# Patient Record
Sex: Female | Born: 1950 | Race: White | Hispanic: No | Marital: Married | Smoking: Never smoker
Health system: Southern US, Community
[De-identification: ages and names within clinical notes are randomized; demographics above are authoritative.]

## PROBLEM LIST (undated history)

## (undated) DIAGNOSIS — F419 Anxiety disorder, unspecified: Secondary | ICD-10-CM

## (undated) DIAGNOSIS — I10 Essential (primary) hypertension: Secondary | ICD-10-CM

## (undated) DIAGNOSIS — R51 Headache: Secondary | ICD-10-CM

## (undated) DIAGNOSIS — J189 Pneumonia, unspecified organism: Secondary | ICD-10-CM

## (undated) DIAGNOSIS — R519 Headache, unspecified: Secondary | ICD-10-CM

## (undated) HISTORY — PX: OVARIAN CYST SURGERY: SHX726

## (undated) HISTORY — PX: COLONOSCOPY: SHX174

## (undated) HISTORY — PX: PILONIDAL CYST EXCISION: SHX744

## (undated) HISTORY — PX: ABDOMINAL HYSTERECTOMY: SHX81

---

## 1998-10-21 ENCOUNTER — Other Ambulatory Visit: Admission: RE | Admit: 1998-10-21 | Discharge: 1998-10-21 | Payer: Self-pay | Admitting: Obstetrics & Gynecology

## 1999-11-24 ENCOUNTER — Other Ambulatory Visit: Admission: RE | Admit: 1999-11-24 | Discharge: 1999-11-24 | Payer: Self-pay | Admitting: Obstetrics & Gynecology

## 2000-12-16 ENCOUNTER — Other Ambulatory Visit: Admission: RE | Admit: 2000-12-16 | Discharge: 2000-12-16 | Payer: Self-pay | Admitting: Obstetrics & Gynecology

## 2002-02-20 ENCOUNTER — Encounter: Payer: Self-pay | Admitting: Family Medicine

## 2002-02-20 ENCOUNTER — Ambulatory Visit (HOSPITAL_COMMUNITY): Admission: RE | Admit: 2002-02-20 | Discharge: 2002-02-20 | Payer: Self-pay | Admitting: Family Medicine

## 2002-02-25 ENCOUNTER — Other Ambulatory Visit: Admission: RE | Admit: 2002-02-25 | Discharge: 2002-02-25 | Payer: Self-pay | Admitting: Obstetrics & Gynecology

## 2002-02-27 ENCOUNTER — Encounter: Admission: RE | Admit: 2002-02-27 | Discharge: 2002-02-27 | Payer: Self-pay | Admitting: Obstetrics & Gynecology

## 2002-02-27 ENCOUNTER — Encounter: Payer: Self-pay | Admitting: Obstetrics & Gynecology

## 2003-03-23 ENCOUNTER — Other Ambulatory Visit: Admission: RE | Admit: 2003-03-23 | Discharge: 2003-03-23 | Payer: Self-pay | Admitting: Obstetrics & Gynecology

## 2003-09-09 ENCOUNTER — Other Ambulatory Visit: Admission: RE | Admit: 2003-09-09 | Discharge: 2003-09-09 | Payer: Self-pay | Admitting: Obstetrics & Gynecology

## 2004-06-29 ENCOUNTER — Other Ambulatory Visit: Admission: RE | Admit: 2004-06-29 | Discharge: 2004-06-29 | Payer: Self-pay | Admitting: Obstetrics & Gynecology

## 2004-12-12 ENCOUNTER — Inpatient Hospital Stay (HOSPITAL_COMMUNITY): Admission: EM | Admit: 2004-12-12 | Discharge: 2004-12-20 | Payer: Self-pay | Admitting: Emergency Medicine

## 2004-12-18 ENCOUNTER — Ambulatory Visit: Payer: Self-pay | Admitting: Pulmonary Disease

## 2004-12-18 ENCOUNTER — Encounter (INDEPENDENT_AMBULATORY_CARE_PROVIDER_SITE_OTHER): Payer: Self-pay | Admitting: Specialist

## 2004-12-27 ENCOUNTER — Encounter: Admission: RE | Admit: 2004-12-27 | Discharge: 2004-12-27 | Payer: Self-pay | Admitting: Family Medicine

## 2005-01-15 ENCOUNTER — Encounter: Admission: RE | Admit: 2005-01-15 | Discharge: 2005-01-15 | Payer: Self-pay | Admitting: Family Medicine

## 2005-02-05 ENCOUNTER — Other Ambulatory Visit: Admission: RE | Admit: 2005-02-05 | Discharge: 2005-02-05 | Payer: Self-pay | Admitting: Obstetrics & Gynecology

## 2005-03-06 ENCOUNTER — Encounter: Admission: RE | Admit: 2005-03-06 | Discharge: 2005-03-06 | Payer: Self-pay | Admitting: Family Medicine

## 2005-04-18 ENCOUNTER — Encounter: Admission: RE | Admit: 2005-04-18 | Discharge: 2005-04-18 | Payer: Self-pay | Admitting: Obstetrics & Gynecology

## 2005-09-10 ENCOUNTER — Encounter: Admission: RE | Admit: 2005-09-10 | Discharge: 2005-09-10 | Payer: Self-pay | Admitting: Obstetrics & Gynecology

## 2006-11-07 ENCOUNTER — Encounter: Admission: RE | Admit: 2006-11-07 | Discharge: 2006-11-07 | Payer: Self-pay | Admitting: Obstetrics & Gynecology

## 2006-11-19 IMAGING — CR DG CHEST DECUBITUS*R*
1 series · 1 of 1 positions shown · non-contrast
Comparison: 12/18/04

CLINICAL DATA: Evaluate pleural effusion.
 RIGHT LATERAL DECUBITUS RADIOGRAPH OF CHEST ? 1 VIEW:

[view not recorded]
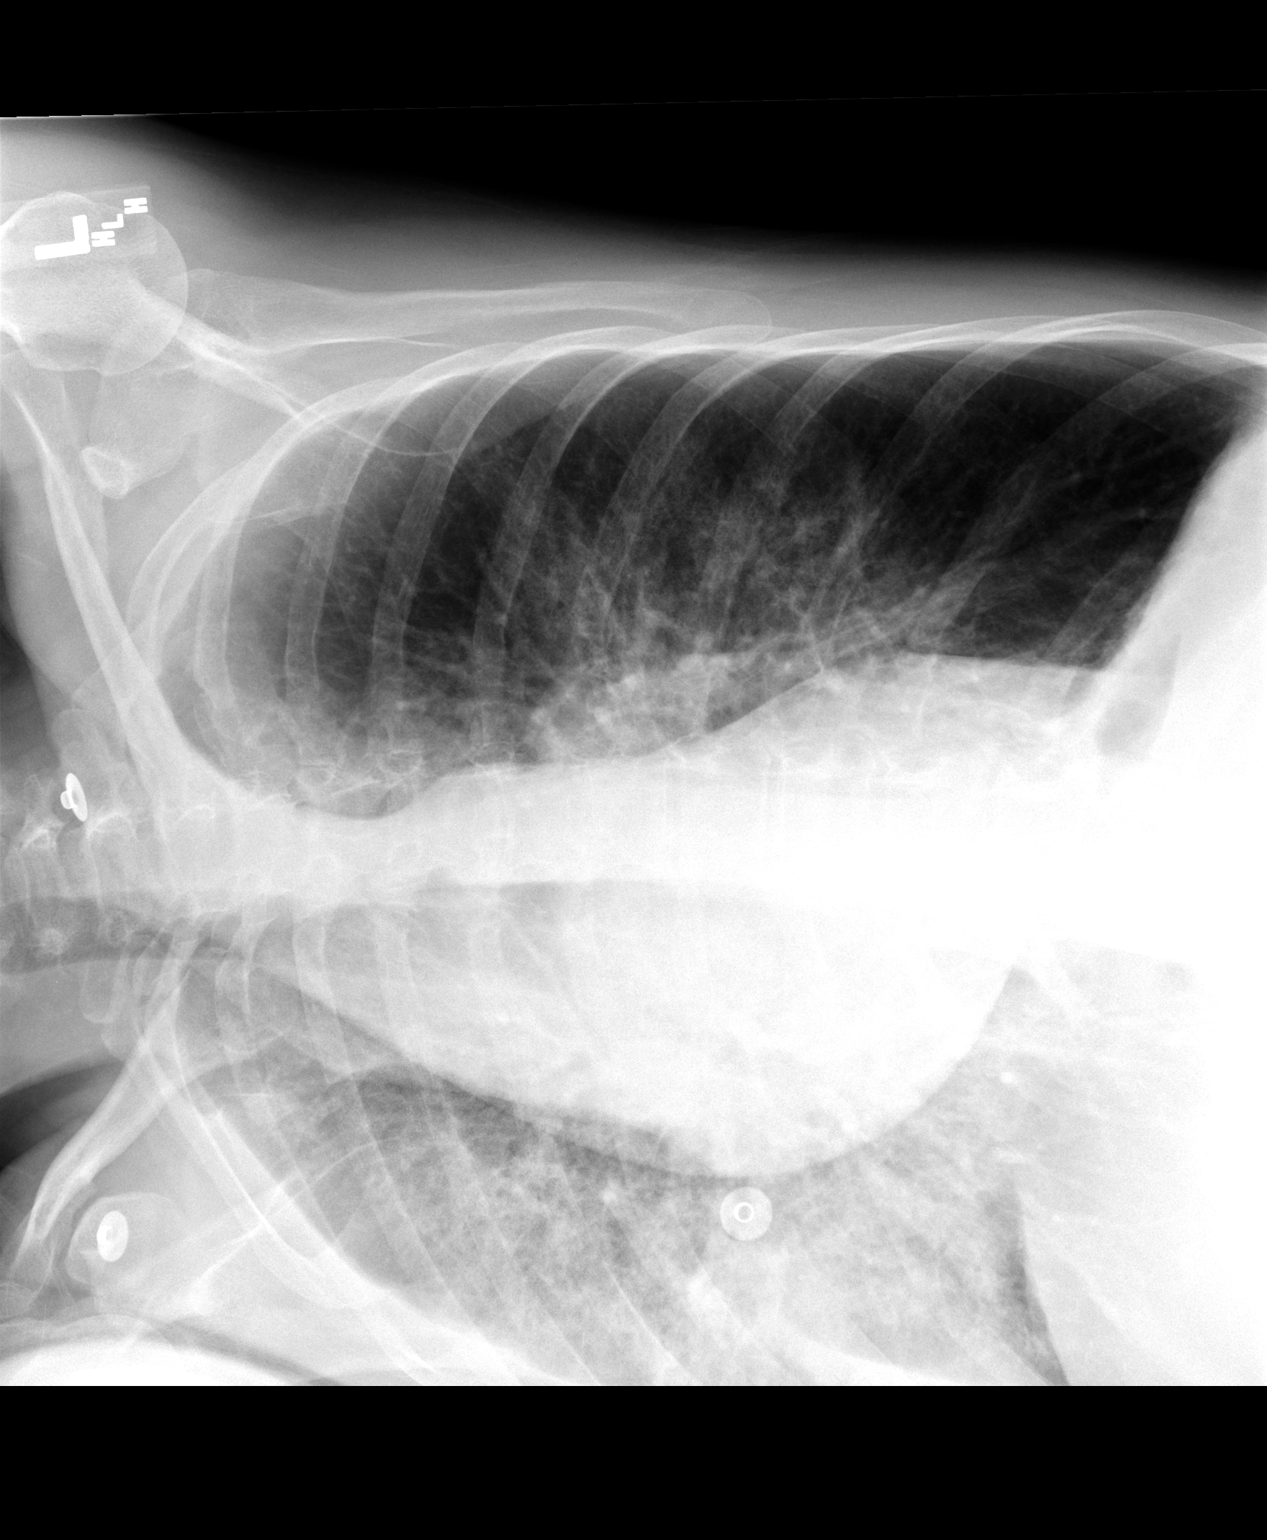

[1 of 1 positions shown; findings below may reference images not displayed]

FINDINGS: There is a moderate sized free flowing right-sided pleural effusion.  Air space disease involves the entire right lung which is unchanged in the interval.  Mild left mid lung air space disease is also noted, unchanged in the interval.
IMPRESSION: Free flowing right effusion.

## 2006-11-19 IMAGING — CR DG CHEST 1V
1 series · 1 of 1 positions shown · non-contrast
Comparison: 11/17/2004.

CLINICAL DATA: Pneumonia.  
 ULTRASOUND-GUIDED THORACENTESIS ON THE RIGHT: 
 After obtaining informed written consent from the patient, the patient?s back was scanned while the patient was sitting.  There is a small to moderate right pleural effusion with some consolidation in the right lower lobe.  The skin was prepped with Betadine.   Two percent lidocaine was used for local anesthesia. An 18-gauge Yueh needle was used for the thoracentesis.  Approximately 180 cc of rust-colored thin fluid which was easy to aspirate were removed from the pleural space.   Needle was removed and the patient tolerated the procedure well. The fluid was sent for laboratory studies as requested.

[view not recorded]
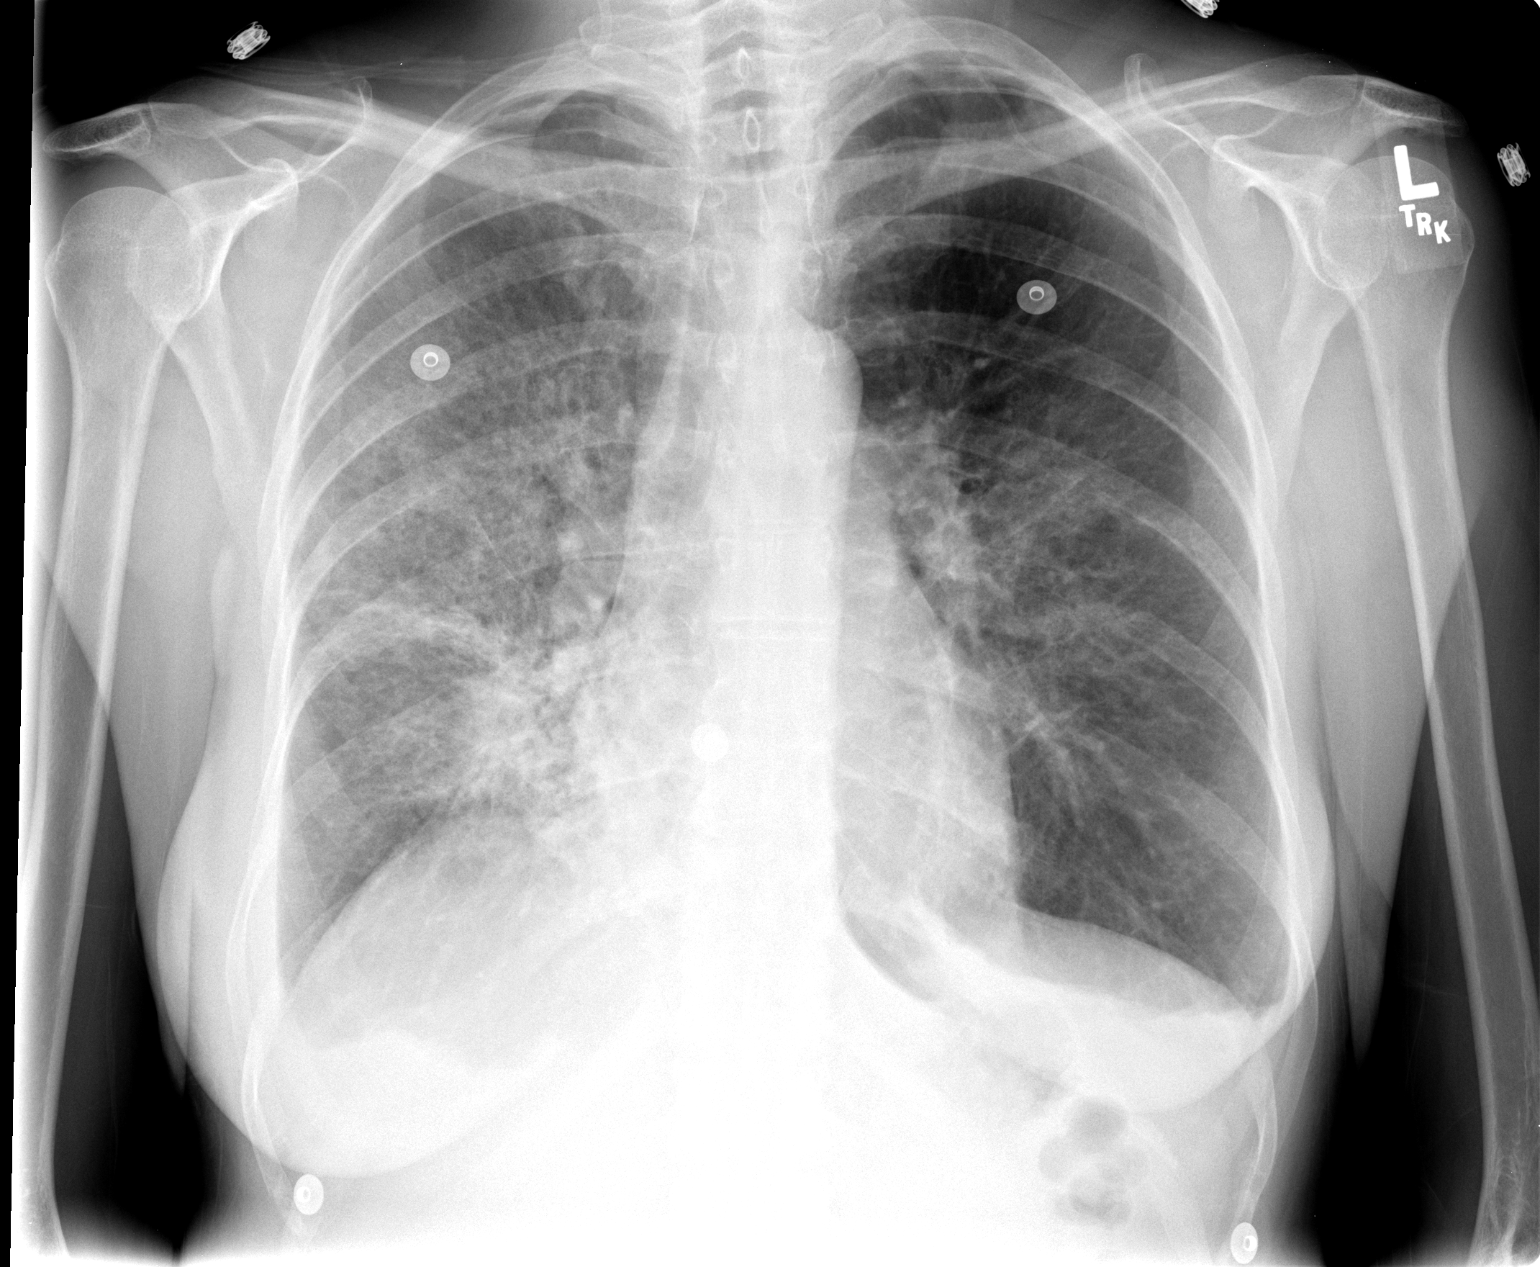

[1 of 1 positions shown; findings below may reference images not displayed]

IMPRESSION: Successful ultrasound-guided thoracentesis on the right.   
 CHEST - 1 VIEW:
Right pleural effusion has improved.  There is no definite pneumothorax although there is possibly a small apical pneumothorax and continued chest x-ray followup is suggested.  Right pneumonia has improved since earlier today.  Left-sided infiltrate also has improved.  There are small bilateral effusions.
IMPRESSION: 1.  Improvement in lung aeration bilaterally. 
 2.  Decrease in right effusion following thoracentesis.  Questionable small right apical pneumothorax versus overlying skin fold.

## 2009-12-20 ENCOUNTER — Encounter: Admission: RE | Admit: 2009-12-20 | Discharge: 2009-12-20 | Payer: Self-pay | Admitting: Obstetrics & Gynecology

## 2010-02-19 ENCOUNTER — Encounter: Payer: Self-pay | Admitting: Obstetrics & Gynecology

## 2010-06-16 NOTE — H&P (Signed)
NAMEMarland Kitchen  CLINTON, WAHLBERG NO.:  [id number]   MEDICAL RECORD NO.:  [id number]          PATIENT TYPE:  EMS   LOCATION:  MAJO                         FACILITY:  [facility]   PHYSICIAN:  Sherin Quarry, MD      DATE OF BIRTH:  08-Apr-1950   DATE OF ADMISSION:  12/12/2004  DATE OF DISCHARGE:                                HISTORY & PHYSICAL   Katrina Gray is a 60 year old lady who works as an Scientist, product/process development. She is generally in excellent health. She does not smoke. On Friday  while visiting her daughter in [place], she began to experience chills  associated with rigors and fever. She took some Aleve that night. The next  day she felt better, but on Sunday she once again had chills and fever  associated with malaise and anorexia. Yesterday she began to experience  substernal chest aching, pleuritic in nature, associated with a cough  productive of somewhat blood-tinged phlegm. She was still up in [place] with  her daughter and eventually she got her family to come get her and brought  her to [place]. On arrival to the emergency room, her temperature was  98.7, blood pressure 109/71, pulse was 126, O2 saturation was 96%. A chest x-  ray was obtained which showed a right middle and lower lobe consolidation  which was fairly extensive. Her laboratory studies are pending at this time.  She is admitted for treatment of a lobar pneumonia.   PAST MEDICAL HISTORY:  Medications:  She takes Lexapro 20 mg daily and uses  a Premarin patch. She is allergic to CODEINE and SULFA DRUGS. Operations:  She had excision of an ovarian cyst at age 30. Subsequently, she had a  TAH/BSO. She has had drainage of pilonidal cyst. Medical illnesses are  essentially none. She recalls that 2 years ago she was treated for a sinus  infection and had a CT scan of her sinuses done at that time. She is also  receiving treatment for depression with Lexapro and does have a prescription  for  Xanax which she will very occasionally take 0.5 mg p.r.n.   FAMILY HISTORY:  Her father died in 35 of cancer of the esophagus. Her  mother is in good health. She has a sister who also is in good health.   SOCIAL HISTORY:  She is very physically active. She does not smoke. She does  not use alcohol or drugs. As previously mentioned, she is employed as an  Set designer.   REVIEW OF SYSTEMS:  HEAD:  She has a dull bitemporal headache. EAR, NOSE AND  THROAT:  She denies earache, sinus pain or sore throat. CHEST:  See above.  She notes that she is slightly dyspneic with exertion. CARDIOVASCULAR:  She  denies orthopnea, PND or ankle edema. GI: Denies nausea, vomiting or  abdominal pain. GU: Denies dysuria or urinary frequency. NEURO:  There is no  history of seizure or stroke. ENDO:  Denies excessive thirst, urinary  frequency or nocturia.   PHYSICAL EXAMINATION:  VITAL SIGNS:  As previously noted, her temperature is  98.7, blood pressure 109/71, pulse is 126, respirations 18.  HEENT:  Within normal limits.  CHEST:  She is noted to have rales and rhonchi at the right base about one-  third of the way up. I do not hear bronchial breath sounds. This area is  dull to percussion. There is mild expiratory wheezing.  CARDIOVASCULAR:  Reveals normal S1 and S2. There are no rubs, murmurs or  gallops.  ABDOMEN:  Benign. Normal bowel sounds without masses or tenderness.  NEUROLOGIC TESTING:  Within normal limits.  EXTREMITIES:  Reveals no evidence of cyanosis or edema.   IMPRESSION:  1.  Lobar pneumonia. The history of rigors followed by fever and malaise and      subsequent cough productive of rusty sputum would be very classical for      pneumococcal pneumonia.  2.  Status post hysterectomy.  3.  History of pilonidal cyst.   PLAN:  Will admit the patient for IV antibiotics and oxygen treatment. Will  follow her oxygenation closely. Will obtain blood cultures and also  obtain  appropriate labs, with particular attention to screening for diabetes. The  patient can be discharged on oral therapy as soon as she is clinically  improved.           ______________________________  Sherin Quarry, MD     SY/MEDQ  D:  12/12/2004  T:  12/12/2004  Job:  [id number]   cc:   Thelma Barge P. Modesto Charon, M.D.  Fax: [phone]

## 2010-06-16 NOTE — Discharge Summary (Signed)
NAMEAJAHNAE, Katrina Gray NO.:  [id number]   MEDICAL RECORD NO.:  [id number]          PATIENT TYPE:  INP   LOCATION:  5018                         FACILITY:  [facility]   PHYSICIAN:  Theone Stanley, MD   DATE OF BIRTH:  [birth year]   DATE OF ADMISSION:  12/12/2004  DATE OF DISCHARGE:  12/20/2004                                 DISCHARGE SUMMARY   ADMISSION DIAGNOSES:  1.  Pneumonia.  2.  Depression.   DISCHARGE DIAGNOSES:  1.  Severe, multi-lobar pneumonia.  2.  Pneumonia.  3.  Depression.   CONSULTATIONS:  Coralyn Helling, M.D., with critical care.   PROCEDURES/DIAGNOSTIC TESTS:  The patient had a CAT scan performed on  November 16 which showed extensive bilateral pneumonia with a very extensive  consolidation pattern in the right lung and patchy involvement of the left  lung, small associated pleural effusions bilaterally, right greater than  left.  The patient had a thoracentesis performed on the right chest on  November 20.  A total of 180 mL of rust-colored fluid was removed.   PERTINENT LABORATORIES:  The patient's white count was 5.6 on discharge,  hemoglobin 11, hematocrit 32, platelets at 547.  Sodium 146, potassium 4.0,  chloride 103, CO2 22, glucose 90, BUN 4, creatinine 0.6.  Fluid analysis of  the pleural fluid showed glucose at 120, total protein less than 3, LVH 136,  pH 7.63.  Fluid appeared cloudy.  White count was 2050.  Segmented  neutrophils were 20, lymphocytes 53, monocytes 27.   HOSPITAL COURSE:  [name] is a very pleasant 60 year old female who  generally is in excellent health and does not smoke.  She had visited her  daughter in [place] and experienced chills, rigors, and fevers.  At that  time, she took Aleve.  She did not think much of it.  On Sunday, again,  prior to her admission, patient again had fever and chills, associated  malaise and anorexia.  One day prior to admission, she had substernal chest  aching of pleuritic nature  associated with cough productive of somewhat  blood-tinged sputum.  She was brought down from [facility] to [place] and  brought to the hospital.  At that time, temperature was 98.7, blood pressure  109/71, pulse 126, saturating 96% on room air.  Chest x-ray was consistent  with right middle and lower lobe consolidation.  At that point in time,  patient was admitted for a community-acquired pneumonia.  She was started on  Rocephin and azithromycin.  Her hospital stay was complicated by hypoxia  which resolved after her thoracentesis.  In addition, her antibiotics had  been switched to vancomycin, Zosyn.  The patient improved.  She was switched  to oral antibiotics, Augmentin.  She was afebrile, was saturating 96% on  room air, and was feeling well enough that it was felt that she could be  discharged.  She was discharged in stable condition.   DISCHARGE MEDICATIONS:  The patient was to continue her home medications and  Augmentin 375 mg one p.o. b.i.d. for an additional seven days.   FOLLOW-UP:  She was to follow up with Dr. Modesto Charon in 1-2 weeks.      Theone Stanley, MD  Electronically Signed     AEJ/MEDQ  D:  12/21/2004  T:  12/21/2004  Job:  [id number]   cc:   Thelma Barge P. Modesto Charon, M.D.  Fax: [phone]

## 2010-06-16 NOTE — Consult Note (Signed)
Katrina, Gray              ACCOUNT NO.:  [id number]   MEDICAL RECORD NO.:  [id number]          PATIENT TYPE:  INP   LOCATION:  3309                         FACILITY:  [facility]   PHYSICIAN:  Coralyn Helling, M.D.      DATE OF BIRTH:  05/04/1950   DATE OF CONSULTATION:  12/18/2004  DATE OF DISCHARGE:                                   CONSULTATION   INDICATION FOR CONSULTATION:  Pneumonia.   I had the pleasure of meeting [name] in the stepdown unit in room 3009.  She was admitted on December 12, 2004 with right middle and right lower lobe  pneumonia with some involvement of the left lower lobe as well.  She said  that on December 08, 2004 she had taken her daughter to [place], was helping  her move in, she says that on that day she started developing a feeling of  chills and shaking with some sweating, and she also felt some malaise and  some difficulty with her breathing.  She said she also had some coughing  with some foamy reddish-colored sputum and that this had progressed from  that Friday through the weekend, and she said that she tried to do more  things as far as assisting her daughter with moving in, but was unable to  because she felt so tired.  She finally came to the hospital on Tuesday,  December 12, 2004, and at that time a chest x-ray was done which showed  consolidation in the right middle lobe and right lower lobe, and some  blunting of the right hemidiaphragm and some areas of infiltrate in the left  lower lobe.  She had subsequently been started on antibiotics with Rocephin  and Zithromax as well as supplemental oxygen.  She said that she would have  some pain in the right side of her chest, adjacent to her diaphragm area.  She said she has also been having some coughing, but this had become less  and her sputum production has become less as well.  She still complained of  having a headache, but has not noticed any recurrence of the feeling of  fevers or chills,  or rigors.  She denies any sinus difficulties or sore  throat.  She denies any difficulty with swallowing.  She denies any skin  rashes anywhere, or joint pain or swelling.  She also denies abdominal pain,  diarrhea, nausea or vomiting.  Besides her travel to [place], she has no  recent travel history.  There is no history of smoking.  She has a cat, but  no other pets, and she does not have any exposure to any birds or farm  animals, and she does not do any extensive gardening activities.  She says  that she has felt that initially her symptoms were improving, but then she  did feel that she was getting a little bit worse, over the last couple of  days.  A repeat chest x-ray from this morning shows worsening of the  infiltrate in the right side, as well as more extension on the left side,  and  there is blunting of the diaphragm again seen.  Her antibiotics were  subsequently changed so that she was started on Vancomycin and switched to  cefepime.  The Rocephin was stopped and she has continued on Zithromax.  At  the present time she says that she feels like her breathing is somewhat  better, and she has been using incentive spirometry and a flutter valve, and  she feels that these have helped as well, although she still complains of  some mild discomfort when she breathes in, mostly on her right side.   PAST MEDICAL HISTORY:  Significant for:  1.  Acute sinusitis approximately two years ago.  2.  She has had an abnormal Pap smear.  3.  Status post abdominal hysterectomy.  4.  She had one of her ovaries removed for an ovarian cyst.  5.  SHE HAS ALLERGIES TO SULFA WHICH SHE SAYS CAUSES HER TO HAVE DIFFICULTY      BREATHING, AND SHE SAID THAT SHE GETS NAUSEOUS IF SHE TAKES CODEINE.   HOME MEDICATIONS:  1.  Lexapro.  2  Xanax.  1.  Premarin for perimenopausal symptoms.   MEDICATIONS IN THE HOSPITAL:  1.  Potassium 20 mg t.i.d.  2.  Lexapro 20 mg daily.  3.  Cefepime 1 gram q.12h.  4.   Mucinex 600 mg b.i.d.  5.  Vancomycin 1 gram q.12h.  6.  Ventolin inhaler.  7.  Zithromax 250 mg daily.  8.  Morphine p.r.n.  9.  Percocet p.r.n.  10  Phenergan p.r.n.  1.  Tylenol p.r.n.  2.  Xanax p.r.n.   FAMILY HISTORY:  Significant for her father who had esophageal cancer.  Her  mother is in good health.  She has one older sister who is in good health.   SOCIAL HISTORY:  There is no history of tobacco or alcohol use.  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  She was  recently married approximately five months ago.  She denies any risk factors  for HIV.   REVIEW OF SYSTEMS:  She complains of headache and some congestion in her  ears, as well as a general feeling of malaise. She denies lymph node  swelling or leg swelling.  She also denies dysuria.   PHYSICAL EXAMINATION:  Her temperature currently is 97.7, T-max 100.3, blood  pressure 125/80, heart rate 100 and regular, oxygen saturation 93% on 30%  Ventimask.  HEENT:  Pupils equal, round and reactive.  Extraocular muscles are intact.  There is no sinus tenderness, no nasal discharge, no oral lesions, no  lymphadenopathy, no thyromegaly.  She had some mild erythema around her neck  region.  HEART:  S1, S2.  There is no murmur or gallop.  CHEST:  She had diffuse bilateral rales with E:A changes and some dullness  to percussion at the bases, but there is no wheezing.  ABDOMEN:  Soft, mildly tender in the right upper quadrant, but no rebound or  guarding and she had positive bowel sounds and there were no masses.  EXTREMITIES:  There is no edema, cyanosis or clubbing.  NEUROLOGIC:  She was alert and oriented x3, 5/5 strength, no sensory  deficits, normal cerebellar function.   LABORATORY VALUE:  She had an arterial blood gas from December 15, 2004  which showed pH 7.45, pcO2 32.6, pO2 57.2.  CBC -- WBC 13, which is increased from previously of 10.5, hemoglobin 10.9, hematocrit 31.3,   platelets 288, sodium 136, potassium  4, chloride 103, CO2 22, BUN 4,  creatinine 0.6, glucose 90, calcium 8.5, BNP from December 16, 2004 was 855.  Blood cultures from December 12, 2004 are negative.  Sputum culture from  December 14, 2004 showed oropharyngeal flora.   IMPRESSION:  This is a 60 year old female with multilobar pneumonia with  progression of her chest x-ray findings and so requiring supplemental oxygen  with some increase in her WBC count.  I would like to further evaluate the  possibility that she has some ongoing pleural effusion and therefore I would  like to arrange for her to have a lateral decubitus film of the right side  to see if there is a significant effusion, and if there is, thrombocytopenia  may be indicated, and pending the results of this, further interventions  could be decided.  I agree with the choice of antibiotics at this time as  she may have had superinfection on top her initial infection given the fact  that she has been here in the hospital for approximately one week, she would  be at risk for having an additional nosocomial type of infection, therefore  I agree with the choice of Vancomycin and cefepime and the continuation of  Zithromax.  I will continue on her supplemental oxygen and keep oxygen  saturation above 92%, and would continue with incentive spirometry and  flutter valve use.  Additionally the progression on her chest x-ray could  just be the natural progression of her original disease with her immune  system response with an inflammatory response, but we will need to monitor  her need for supplemental oxygen, but we will follow along with you during  her hospitalization.      Coralyn Helling, M.D.  Electronically Signed     VS/MEDQ  D:  12/18/2004  T:  12/18/2004  Job:  [id number]

## 2010-09-12 ENCOUNTER — Other Ambulatory Visit: Payer: Self-pay | Admitting: Obstetrics & Gynecology

## 2010-09-12 DIAGNOSIS — Z1231 Encounter for screening mammogram for malignant neoplasm of breast: Secondary | ICD-10-CM

## 2010-12-07 ENCOUNTER — Ambulatory Visit
Admission: RE | Admit: 2010-12-07 | Discharge: 2010-12-07 | Disposition: A | Discharge status: Home / Self Care | Payer: BC Managed Care – PPO | Source: Ambulatory Visit | Attending: Obstetrics & Gynecology | Admitting: Obstetrics & Gynecology

## 2010-12-07 DIAGNOSIS — Z1231 Encounter for screening mammogram for malignant neoplasm of breast: Secondary | ICD-10-CM

## 2011-10-23 ENCOUNTER — Other Ambulatory Visit: Payer: Self-pay | Admitting: Obstetrics & Gynecology

## 2011-10-23 DIAGNOSIS — Z1231 Encounter for screening mammogram for malignant neoplasm of breast: Secondary | ICD-10-CM

## 2011-12-12 ENCOUNTER — Ambulatory Visit
Admission: RE | Admit: 2011-12-12 | Discharge: 2011-12-12 | Disposition: A | Discharge status: Home / Self Care | Payer: BC Managed Care – PPO | Source: Ambulatory Visit | Attending: Obstetrics & Gynecology | Admitting: Obstetrics & Gynecology

## 2011-12-12 DIAGNOSIS — Z1231 Encounter for screening mammogram for malignant neoplasm of breast: Secondary | ICD-10-CM

## 2012-12-01 ENCOUNTER — Other Ambulatory Visit: Payer: Self-pay

## 2012-12-01 DIAGNOSIS — Z1231 Encounter for screening mammogram for malignant neoplasm of breast: Secondary | ICD-10-CM

## 2012-12-29 ENCOUNTER — Ambulatory Visit
Admission: RE | Admit: 2012-12-29 | Discharge: 2012-12-29 | Disposition: A | Discharge status: Home / Self Care | Payer: BC Managed Care – PPO | Source: Ambulatory Visit

## 2012-12-29 DIAGNOSIS — Z1231 Encounter for screening mammogram for malignant neoplasm of breast: Secondary | ICD-10-CM

## 2013-12-04 ENCOUNTER — Other Ambulatory Visit: Payer: Self-pay

## 2013-12-04 DIAGNOSIS — Z1231 Encounter for screening mammogram for malignant neoplasm of breast: Secondary | ICD-10-CM

## 2013-12-30 ENCOUNTER — Ambulatory Visit
Admission: RE | Admit: 2013-12-30 | Discharge: 2013-12-30 | Disposition: A | Discharge status: Home / Self Care | Payer: BC Managed Care – PPO | Source: Ambulatory Visit

## 2013-12-30 DIAGNOSIS — Z1231 Encounter for screening mammogram for malignant neoplasm of breast: Secondary | ICD-10-CM

## 2014-06-21 ENCOUNTER — Other Ambulatory Visit: Payer: Self-pay | Admitting: Obstetrics & Gynecology

## 2014-06-22 LAB — CYTOLOGY - PAP

## 2014-11-17 ENCOUNTER — Encounter (HOSPITAL_BASED_OUTPATIENT_CLINIC_OR_DEPARTMENT_OTHER): Payer: Self-pay | Admitting: *Deleted

## 2014-11-17 ENCOUNTER — Emergency Department (HOSPITAL_BASED_OUTPATIENT_CLINIC_OR_DEPARTMENT_OTHER)
Admission: EM | Admit: 2014-11-17 | Discharge: 2014-11-17 | Disposition: A | Discharge status: Home / Self Care | Payer: BC Managed Care – PPO | Attending: Emergency Medicine | Admitting: Emergency Medicine

## 2014-11-17 ENCOUNTER — Emergency Department (HOSPITAL_BASED_OUTPATIENT_CLINIC_OR_DEPARTMENT_OTHER): Payer: BC Managed Care – PPO

## 2014-11-17 DIAGNOSIS — I10 Essential (primary) hypertension: Secondary | ICD-10-CM | POA: Insufficient documentation

## 2014-11-17 DIAGNOSIS — Z79899 Other long term (current) drug therapy: Secondary | ICD-10-CM | POA: Insufficient documentation

## 2014-11-17 DIAGNOSIS — W06XXXA Fall from bed, initial encounter: Secondary | ICD-10-CM | POA: Insufficient documentation

## 2014-11-17 DIAGNOSIS — F419 Anxiety disorder, unspecified: Secondary | ICD-10-CM | POA: Insufficient documentation

## 2014-11-17 DIAGNOSIS — Y998 Other external cause status: Secondary | ICD-10-CM | POA: Insufficient documentation

## 2014-11-17 DIAGNOSIS — Y9389 Activity, other specified: Secondary | ICD-10-CM | POA: Insufficient documentation

## 2014-11-17 DIAGNOSIS — S42001A Fracture of unspecified part of right clavicle, initial encounter for closed fracture: Secondary | ICD-10-CM | POA: Diagnosis not present

## 2014-11-17 DIAGNOSIS — Y9289 Other specified places as the place of occurrence of the external cause: Secondary | ICD-10-CM | POA: Diagnosis not present

## 2014-11-17 DIAGNOSIS — S4991XA Unspecified injury of right shoulder and upper arm, initial encounter: Secondary | ICD-10-CM | POA: Diagnosis present

## 2014-11-17 HISTORY — DX: Essential (primary) hypertension: I10

## 2014-11-17 HISTORY — DX: Anxiety disorder, unspecified: F41.9

## 2014-11-17 MED ORDER — OXYCODONE-ACETAMINOPHEN 5-325 MG PO TABS
1.0000 | ORAL_TABLET | Freq: Once | ORAL | Status: AC
  Administered 2014-11-17: 1 via ORAL
  Filled 2014-11-17: qty 1

## 2014-11-17 MED ORDER — OXYCODONE-ACETAMINOPHEN 5-325 MG PO TABS: 1.0000 | ORAL_TABLET | ORAL | Status: DC | PRN

## 2014-11-17 NOTE — ED Provider Notes (Signed)
CSN: [id number]     Arrival date & time 11/17/14  0944 History   First MD Initiated Contact with Patient 11/17/14 1002     Chief Complaint  Patient presents with  . Shoulder Pain     (Consider location/radiation/quality/duration/timing/severity/associated sxs/prior Treatment) HPI Comments: Patient rolled out of bed about 3:30 this morning onto a hardwood floor onto her right shoulder. Immediately had pain to R clavicle. Did not hit head or lose consciousness. Took naproxen without relief. No focal weakness, numbness or tingling. No head or neck pain. No back pain, chest, abdominal pain. No other medical problems no blood thinner use.  The history is provided by the spouse and the patient.    Past Medical History  Diagnosis Date  . Anxiety   . Hypertension    Past Surgical History  Procedure Laterality Date  . Abdominal hysterectomy    . Ovarian cyst surgery     No family history on file. Social History  Substance Use Topics  . Smoking status: Never Smoker   . Smokeless tobacco: Never Used  . Alcohol Use: Yes     Comment: 1 glass wine/day   OB History    No data available     Review of Systems  Constitutional: Negative for fever, activity change, appetite change and fatigue.  Respiratory: Negative for cough, chest tightness and shortness of breath.   Cardiovascular: Negative for chest pain.  Gastrointestinal: Negative for nausea, vomiting and abdominal pain.  Genitourinary: Negative for dysuria, hematuria, vaginal bleeding and vaginal discharge.  Musculoskeletal: Positive for myalgias and arthralgias. Negative for back pain and neck pain.  Skin: Negative for rash.  Neurological: Negative for dizziness, weakness and headaches.  A complete 10 system review of systems was obtained and all systems are negative except as noted in the HPI and PMH.      Allergies  Sulfa antibiotics and Codeine  Home Medications   Prior to Admission medications   Medication Sig Start  Date End Date Taking? Authorizing Provider  ALPRAZolam Prudy Feeler(XANAX) 0.5 MG tablet Take 0.25 mg by mouth at bedtime as needed for anxiety.   Yes Historical Provider, MD  escitalopram (LEXAPRO) 10 MG tablet Take 10 mg by mouth daily.   Yes Historical Provider, MD  estradiol (VIVELLE-DOT) 0.1 MG/24HR patch Place 1 patch onto the skin 2 (two) times a week.   Yes Historical Provider, MD  telmisartan (MICARDIS) 20 MG tablet Take 20 mg by mouth daily.   Yes Historical Provider, MD  oxyCODONE-acetaminophen (PERCOCET/ROXICET) 5-325 MG tablet Take 1 tablet by mouth every 4 (four) hours as needed for severe pain. 11/17/14   Glynn OctaveStephen Eswin Worrell, MD   BP 118/74 mmHg  Pulse 75  Temp(Src) 98 F (36.7 C) (Oral)  Resp 18  Ht 5\' 5"  (1.651 m)  Wt 150 lb (68.04 kg)  BMI 24.96 kg/m2  SpO2 100% Physical Exam  Constitutional: She is oriented to person, place, and time. She appears well-developed and well-nourished. No distress.  HENT:  Head: Normocephalic and atraumatic.  Mouth/Throat: Oropharynx is clear and moist. No oropharyngeal exudate.  Eyes: Conjunctivae and EOM are normal. Pupils are equal, round, and reactive to light.  Neck: Normal range of motion. Neck supple.  No meningismus.  Cardiovascular: Normal rate, regular rhythm, normal heart sounds and intact distal pulses.   No murmur heard. Pulmonary/Chest: Effort normal and breath sounds normal. No respiratory distress.  Abdominal: Soft. There is no tenderness. There is no rebound and no guarding.  Musculoskeletal: She exhibits edema and tenderness.  Deformity and tenderness to R clavicle. No tenting of skin. Intact axillary nerve sensation, radial pulse, cardinal hand movements.  No tenderness to shoulder joint or elbow  Neurological: She is alert and oriented to person, place, and time. No cranial nerve deficit. She exhibits normal muscle tone. Coordination normal.  No ataxia on finger to nose bilaterally. No pronator drift. 5/5 strength throughout. CN 2-12  intact. Negative Romberg. Equal grip strength. Sensation intact. Gait is normal.   Skin: Skin is warm.  Psychiatric: She has a normal mood and affect. Her behavior is normal.  Nursing note and vitals reviewed.   ED Course  Procedures (including critical care time) Labs Review Labs Reviewed - No data to display  Imaging Review Dg Clavicle Right  11/17/2014  CLINICAL DATA:  Status post fall odd bed. EXAM: RIGHT CLAVICLE - 2+ VIEWS COMPARISON:  None. FINDINGS: There is a comminuted distal right clavicular fracture with 3.5 cm of overriding between the fracture fragments and 1.8 cm of inferior displacement of the distal fracture fragment. The acromioclavicular joint is congruent. The sternoclavicular joint is congruent. IMPRESSION: 1. Displaced, comminuted right distal clavicular fracture. Electronically Signed   By: Elige Ko   On: 11/17/2014 10:34   Dg Shoulder Right  11/17/2014  CLINICAL DATA:  Larey Seat out of bed last night. Severe right shoulder and clavicle pain. EXAM: RIGHT SHOULDER - 2+ VIEW COMPARISON:  None. FINDINGS: Displaced fracture noted through the distal right clavicle with overlapping fracture fragments. AC joint and glenohumeral joint are intact. No proximal humeral fracture. IMPRESSION: Displaced, overlapping distal right clavicle fracture. Electronically Signed   By: Charlett Nose M.D.   On: 11/17/2014 10:31   I have personally reviewed and evaluated these images and lab results as part of my medical decision-making.   EKG Interpretation None      MDM   Final diagnoses:  Clavicle fracture, right, closed, initial encounter   Fall from bed with likely right clavicle fracture. No head injury. Neurovascularly intact.  Displaced clavicle fracture discussed with Dr. Ave Filter orthopedics. He agrees with sling and outpatient follow-up in 2 days. Pain control, immobilization. Return precautions discussed.  Glynn Octave, MD 11/17/14 1346

## 2014-11-17 NOTE — ED Notes (Signed)
MD at bedside. 

## 2014-11-17 NOTE — ED Notes (Signed)
Pt d/c home with family- directed to pharmacy to pick up medications

## 2014-11-17 NOTE — ED Notes (Signed)
EMT at bedside to apply sling 

## 2014-11-17 NOTE — ED Notes (Signed)
Pt reports she rolled out of bed onto hardwood floor onto right shoulder. C/o clavicle pain

## 2014-11-17 NOTE — Discharge Instructions (Signed)
Clavicle Fracture  The clavicle, also called the collarbone, is the long bone that connects your shoulder to your rib cage. You can feel your collarbone at the top of your shoulders and rib cage. A clavicle fracture is a broken clavicle. It is a common injury that can happen at any age.   CAUSES  Common causes of a clavicle fracture include:  · A direct blow to your shoulder.  · A car accident.  · A fall, especially if you try to break your fall with an outstretched arm.  RISK FACTORS  You may be at increased risk if:  · You are younger than 25 years or older than 75 years. Most clavicle fractures happen to people who are younger than 25 years.  · You are a female.  · You play contact sports.  SIGNS AND SYMPTOMS  A fractured clavicle is painful. It also makes it hard to move your arm. Other signs and symptoms may include:  · A shoulder that drops downward and forward.  · Pain when trying to lift your shoulder.  · Bruising, swelling, and tenderness over your clavicle.  · A grinding noise when you try to move your shoulder.  · A bump over your clavicle.  DIAGNOSIS  Your health care provider can usually diagnose a clavicle fracture by asking about your injury and examining your shoulder and clavicle. He or she may take an X-ray to determine the position of your clavicle.  TREATMENT  Treatment depends on the position of your clavicle after the fracture:  · If the broken ends of the bone are not out of place, your health care provider may put your arm in a sling or wrap a support bandage around your chest (figure-of-eight wrap).  · If the broken ends of the bone are out of place, you may need surgery. Surgery may involve placing screws, pins, or plates to keep your clavicle stable while it heals. Healing may take about 3 months.  When your health care provider thinks your fracture has healed enough, you may have to do physical therapy to regain normal movement and build up your arm strength.  HOME CARE INSTRUCTIONS    · Apply ice to the injured area:    Put ice in a plastic bag.    Place a towel between your skin and the bag.    Leave the ice on for 20 minutes, 2-3 times a day.  · If you have a wrap or splint:    Wear it all the time, and remove it only to take a bath or shower.    When you bathe or shower, keep your shoulder in the same position as when the sling or wrap is on.    Do not lift your arm.  · If you have a figure-of-eight wrap:    Another person must tighten it every day.    It should be tight enough to hold your shoulders back.    Allow enough room to place your index finger between your body and the strap.    Loosen the wrap immediately if you feel numbness or tingling in your hands.  · Only take medicines as directed by your health care provider.  · Avoid activities that make the injury or pain worse for 4-6 weeks after surgery.  · Keep all follow-up appointments.  SEEK MEDICAL CARE IF:   Your medicine is not helping to relieve pain and swelling.  SEEK IMMEDIATE MEDICAL CARE IF:   Your arm is   numb, cold, or pale, even when the splint is loose.  MAKE SURE YOU:   · Understand these instructions.  · Will watch your condition.  · Will get help right away if you are not doing well or get worse.     This information is not intended to replace advice given to you by your health care provider. Make sure you discuss any questions you have with your health care provider.     Document Released: 10/25/2004 Document Revised: 01/20/2013 Document Reviewed: 12/08/2012  Elsevier Interactive Patient Education ©2016 [employer]

## 2014-11-19 ENCOUNTER — Other Ambulatory Visit: Payer: Self-pay | Admitting: Orthopedic Surgery

## 2014-11-22 ENCOUNTER — Encounter (HOSPITAL_BASED_OUTPATIENT_CLINIC_OR_DEPARTMENT_OTHER)
Admission: RE | Disposition: A | Discharge status: Home / Self Care | Payer: Self-pay | Source: Ambulatory Visit | Attending: Orthopedic Surgery

## 2014-11-22 ENCOUNTER — Encounter (HOSPITAL_BASED_OUTPATIENT_CLINIC_OR_DEPARTMENT_OTHER): Payer: Self-pay | Admitting: *Deleted

## 2014-11-22 ENCOUNTER — Ambulatory Visit (HOSPITAL_COMMUNITY)
Admission: RE | Admit: 2014-11-22 | Discharge: 2014-11-22 | Disposition: A | Discharge status: Home / Self Care | Payer: BC Managed Care – PPO | Source: Ambulatory Visit | Attending: Orthopedic Surgery | Admitting: Orthopedic Surgery

## 2014-11-22 ENCOUNTER — Ambulatory Visit (HOSPITAL_BASED_OUTPATIENT_CLINIC_OR_DEPARTMENT_OTHER): Payer: BC Managed Care – PPO | Admitting: Anesthesiology

## 2014-11-22 ENCOUNTER — Ambulatory Visit (HOSPITAL_COMMUNITY): Payer: BC Managed Care – PPO

## 2014-11-22 DIAGNOSIS — W06XXXA Fall from bed, initial encounter: Secondary | ICD-10-CM | POA: Diagnosis not present

## 2014-11-22 DIAGNOSIS — F419 Anxiety disorder, unspecified: Secondary | ICD-10-CM | POA: Diagnosis not present

## 2014-11-22 DIAGNOSIS — S42021A Displaced fracture of shaft of right clavicle, initial encounter for closed fracture: Secondary | ICD-10-CM | POA: Insufficient documentation

## 2014-11-22 DIAGNOSIS — I10 Essential (primary) hypertension: Secondary | ICD-10-CM | POA: Insufficient documentation

## 2014-11-22 DIAGNOSIS — Z419 Encounter for procedure for purposes other than remedying health state, unspecified: Secondary | ICD-10-CM

## 2014-11-22 HISTORY — PX: ORIF CLAVICULAR FRACTURE: SHX5055

## 2014-11-22 SURGERY — OPEN REDUCTION INTERNAL FIXATION (ORIF) CLAVICULAR FRACTURE
Anesthesia: Regional | Site: Shoulder | Laterality: Right

## 2014-11-22 MED ORDER — LIDOCAINE HCL (CARDIAC) 20 MG/ML IV SOLN
INTRAVENOUS | Status: DC | PRN
  Administered 2014-11-22: 50 mg via INTRAVENOUS

## 2014-11-22 MED ORDER — MIDAZOLAM HCL 2 MG/2ML IJ SOLN
INTRAMUSCULAR | Status: AC
  Filled 2014-11-22: qty 2

## 2014-11-22 MED ORDER — PHENYLEPHRINE HCL 10 MG/ML IJ SOLN
10.0000 mg | INTRAMUSCULAR | Status: DC | PRN
  Administered 2014-11-22: 40 ug/min via INTRAVENOUS

## 2014-11-22 MED ORDER — OXYCODONE-ACETAMINOPHEN 5-325 MG PO TABS: 1.0000 | ORAL_TABLET | ORAL | Status: DC | PRN

## 2014-11-22 MED ORDER — OXYCODONE HCL 5 MG PO TABS: 5.0000 mg | ORAL_TABLET | Freq: Once | ORAL | Status: DC | PRN

## 2014-11-22 MED ORDER — BUPIVACAINE-EPINEPHRINE (PF) 0.5% -1:200000 IJ SOLN
INTRAMUSCULAR | Status: DC | PRN
  Administered 2014-11-22: 25 mL via PERINEURAL

## 2014-11-22 MED ORDER — FENTANYL CITRATE (PF) 100 MCG/2ML IJ SOLN
INTRAMUSCULAR | Status: AC
  Filled 2014-11-22: qty 4

## 2014-11-22 MED ORDER — MIDAZOLAM HCL 2 MG/2ML IJ SOLN
1.0000 mg | INTRAMUSCULAR | Status: DC | PRN
  Administered 2014-11-22: 2 mg via INTRAVENOUS
  Administered 2014-11-22: 1 mg via INTRAVENOUS

## 2014-11-22 MED ORDER — POVIDONE-IODINE 7.5 % EX SOLN: Freq: Once | CUTANEOUS | Status: DC

## 2014-11-22 MED ORDER — GLYCOPYRROLATE 0.2 MG/ML IJ SOLN: 0.2000 mg | Freq: Once | INTRAMUSCULAR | Status: DC | PRN

## 2014-11-22 MED ORDER — CEFAZOLIN SODIUM-DEXTROSE 2-3 GM-% IV SOLR
INTRAVENOUS | Status: AC
  Filled 2014-11-22: qty 50

## 2014-11-22 MED ORDER — PHENYLEPHRINE HCL 10 MG/ML IJ SOLN
INTRAMUSCULAR | Status: DC | PRN
  Administered 2014-11-22 (×2): 40 ug via INTRAVENOUS

## 2014-11-22 MED ORDER — DEXAMETHASONE SODIUM PHOSPHATE 4 MG/ML IJ SOLN
INTRAMUSCULAR | Status: DC | PRN
  Administered 2014-11-22: 10 mg via INTRAVENOUS

## 2014-11-22 MED ORDER — LACTATED RINGERS IV SOLN
INTRAVENOUS | Status: DC
  Administered 2014-11-22 (×2): via INTRAVENOUS

## 2014-11-22 MED ORDER — MEPERIDINE HCL 25 MG/ML IJ SOLN: 6.2500 mg | INTRAMUSCULAR | Status: DC | PRN

## 2014-11-22 MED ORDER — SCOPOLAMINE 1 MG/3DAYS TD PT72: 1.0000 | MEDICATED_PATCH | Freq: Once | TRANSDERMAL | Status: DC | PRN

## 2014-11-22 MED ORDER — DOCUSATE SODIUM 100 MG PO CAPS: 100.0000 mg | ORAL_CAPSULE | Freq: Three times a day (TID) | ORAL | Status: DC | PRN

## 2014-11-22 MED ORDER — HYDROMORPHONE HCL 1 MG/ML IJ SOLN: 0.2500 mg | INTRAMUSCULAR | Status: DC | PRN

## 2014-11-22 MED ORDER — OXYCODONE HCL 5 MG/5ML PO SOLN: 5.0000 mg | Freq: Once | ORAL | Status: DC | PRN

## 2014-11-22 MED ORDER — SUCCINYLCHOLINE CHLORIDE 20 MG/ML IJ SOLN
INTRAMUSCULAR | Status: DC | PRN
  Administered 2014-11-22: 100 mg via INTRAVENOUS

## 2014-11-22 MED ORDER — CEFAZOLIN SODIUM-DEXTROSE 2-3 GM-% IV SOLR
2.0000 g | INTRAVENOUS | Status: AC
  Administered 2014-11-22: 2 g via INTRAVENOUS

## 2014-11-22 MED ORDER — FENTANYL CITRATE (PF) 100 MCG/2ML IJ SOLN
50.0000 ug | INTRAMUSCULAR | Status: DC | PRN
  Administered 2014-11-22: 100 ug via INTRAVENOUS
  Administered 2014-11-22: 50 ug via INTRAVENOUS

## 2014-11-22 MED ORDER — FENTANYL CITRATE (PF) 100 MCG/2ML IJ SOLN
INTRAMUSCULAR | Status: AC
  Filled 2014-11-22: qty 2

## 2014-11-22 MED ORDER — MIDAZOLAM HCL 2 MG/2ML IJ SOLN
INTRAMUSCULAR | Status: AC
  Filled 2014-11-22: qty 4

## 2014-11-22 MED ORDER — PROPOFOL 10 MG/ML IV BOLUS
INTRAVENOUS | Status: DC | PRN
  Administered 2014-11-22: 200 mg via INTRAVENOUS

## 2014-11-22 MED ORDER — EPHEDRINE SULFATE 50 MG/ML IJ SOLN
INTRAMUSCULAR | Status: DC | PRN
  Administered 2014-11-22: 15 mg via INTRAVENOUS
  Administered 2014-11-22: 10 mg via INTRAVENOUS

## 2014-11-22 MED ORDER — ONDANSETRON HCL 4 MG/2ML IJ SOLN
INTRAMUSCULAR | Status: DC | PRN
  Administered 2014-11-22: 4 mg via INTRAVENOUS

## 2014-11-22 SURGICAL SUPPLY — 68 items
BIT DRILL 2.8X5 QR DISP (BIT) ×3 IMPLANT
BLADE CLIPPER SURG (BLADE) IMPLANT
BLADE SURG 15 STRL LF DISP TIS (BLADE) ×2 IMPLANT
BLADE SURG 15 STRL SS (BLADE) ×4
CHLORAPREP W/TINT 26ML (MISCELLANEOUS) ×3 IMPLANT
CLOSURE WOUND 1/2 X4 (GAUZE/BANDAGES/DRESSINGS) ×1
DECANTER SPIKE VIAL GLASS SM (MISCELLANEOUS) IMPLANT
DRAPE C-ARM 42X72 X-RAY (DRAPES) ×3 IMPLANT
DRAPE INCISE IOBAN 66X45 STRL (DRAPES) ×3 IMPLANT
DRAPE SURG 17X23 STRL (DRAPES) ×3 IMPLANT
DRAPE U 20/CS (DRAPES) ×3 IMPLANT
DRAPE U-SHAPE 47X51 STRL (DRAPES) ×3 IMPLANT
DRAPE U-SHAPE 76X120 STRL (DRAPES) ×6 IMPLANT
DRSG TEGADERM 2-3/8X2-3/4 SM (GAUZE/BANDAGES/DRESSINGS) ×3 IMPLANT
DRSG TEGADERM 4X4.75 (GAUZE/BANDAGES/DRESSINGS) ×6 IMPLANT
ELECT REM PT RETURN 9FT ADLT (ELECTROSURGICAL) ×3
ELECTRODE REM PT RTRN 9FT ADLT (ELECTROSURGICAL) ×1 IMPLANT
GAUZE SPONGE 4X4 12PLY STRL (GAUZE/BANDAGES/DRESSINGS) ×3 IMPLANT
GLOVE BIO SURGEON STRL SZ7 (GLOVE) ×6 IMPLANT
GLOVE BIO SURGEON STRL SZ7.5 (GLOVE) ×3 IMPLANT
GLOVE BIOGEL PI IND STRL 7.0 (GLOVE) ×2 IMPLANT
GLOVE BIOGEL PI IND STRL 8 (GLOVE) ×1 IMPLANT
GLOVE BIOGEL PI INDICATOR 7.0 (GLOVE) ×4
GLOVE BIOGEL PI INDICATOR 8 (GLOVE) ×2
GLOVE ECLIPSE 6.5 STRL STRAW (GLOVE) ×3 IMPLANT
GOWN STRL REUS W/ TWL LRG LVL3 (GOWN DISPOSABLE) ×2 IMPLANT
GOWN STRL REUS W/ TWL XL LVL3 (GOWN DISPOSABLE) ×1 IMPLANT
GOWN STRL REUS W/TWL LRG LVL3 (GOWN DISPOSABLE) ×4
GOWN STRL REUS W/TWL XL LVL3 (GOWN DISPOSABLE) ×2
NS IRRIG 1000ML POUR BTL (IV SOLUTION) ×3 IMPLANT
PACK ARTHROSCOPY DSU (CUSTOM PROCEDURE TRAY) ×3 IMPLANT
PACK BASIN DAY SURGERY FS (CUSTOM PROCEDURE TRAY) ×3 IMPLANT
PENCIL BUTTON HOLSTER BLD 10FT (ELECTRODE) ×3 IMPLANT
PLATE CLAVICLE DISTAL 12 HOLE (Plate) ×3 IMPLANT
RETRIEVER SUT HEWSON (MISCELLANEOUS) IMPLANT
SCREW HEXALOBE LOCKING 3.5X14M (Screw) ×6 IMPLANT
SCREW HEXALOBE LOCKING 3.5X16M (Screw) ×3 IMPLANT
SCREW HEXALOBE NON-LOCK 3.5X14 (Screw) ×3 IMPLANT
SCREW LOCK 12X3.5X HEXALOBE (Screw) ×1 IMPLANT
SCREW LOCKING 3.5X12 (Screw) ×2 IMPLANT
SCREW NON LOCK 3.5X10MM (Screw) ×3 IMPLANT
SCREW NONLOCK HEX 3.5X12 (Screw) ×6 IMPLANT
SLEEVE SCD COMPRESS KNEE MED (MISCELLANEOUS) ×3 IMPLANT
SLING ARM IMMOBILIZER LRG (SOFTGOODS) ×3 IMPLANT
SLING ARM IMMOBILIZER MED (SOFTGOODS) IMPLANT
SLING ARM LRG ADULT FOAM STRAP (SOFTGOODS) IMPLANT
SLING ARM MED ADULT FOAM STRAP (SOFTGOODS) IMPLANT
SLING ARM XL FOAM STRAP (SOFTGOODS) IMPLANT
SPONGE LAP 18X18 X RAY DECT (DISPOSABLE) ×3 IMPLANT
STRIP CLOSURE SKIN 1/2X4 (GAUZE/BANDAGES/DRESSINGS) ×2 IMPLANT
SUCTION FRAZIER TIP 10 FR DISP (SUCTIONS) IMPLANT
SUPPORT WRAP ARM LG (MISCELLANEOUS) ×3 IMPLANT
SUT ETHILON 4 0 PS 2 18 (SUTURE) IMPLANT
SUT FIBERWIRE #2 38 T-5 BLUE (SUTURE) ×3
SUT MNCRL AB 4-0 PS2 18 (SUTURE) ×3 IMPLANT
SUT TIGER TAPE 7 IN WHITE (SUTURE) IMPLANT
SUT VIC AB 0 CT1 27 (SUTURE) ×2
SUT VIC AB 0 CT1 27XBRD ANBCTR (SUTURE) ×1 IMPLANT
SUT VIC AB 2-0 SH 27 (SUTURE) ×2
SUT VIC AB 2-0 SH 27XBRD (SUTURE) ×1 IMPLANT
SUTURE FIBERWR #2 38 T-5 BLUE (SUTURE) ×1 IMPLANT
SYR BULB 3OZ (MISCELLANEOUS) ×3 IMPLANT
TAPE FIBER 2MM 7IN #2 BLUE (SUTURE) IMPLANT
TOWEL OR 17X24 6PK STRL BLUE (TOWEL DISPOSABLE) ×3 IMPLANT
TOWEL OR NON WOVEN STRL DISP B (DISPOSABLE) ×3 IMPLANT
TUBE CONNECTING 20'X1/4 (TUBING)
TUBE CONNECTING 20X1/4 (TUBING) IMPLANT
YANKAUER SUCT BULB TIP NO VENT (SUCTIONS) ×3 IMPLANT

## 2014-11-22 NOTE — Anesthesia Procedure Notes (Addendum)
Anesthesia Regional Block:  Interscalene brachial plexus block  Pre-Anesthetic Checklist: ,, timeout performed, Correct Patient, Correct Site, Correct Laterality, Correct Procedure, Correct Position, site marked, Risks and benefits discussed,  Surgical consent,  Pre-op evaluation,  At surgeon's request and post-op pain management  Laterality: Right  Prep: chloraprep       Needles:  Injection technique: Single-shot  Needle Type: Echogenic Needle     Needle Length: 5cm 5 cm Needle Gauge: 21 and 21 G    Additional Needles:  Procedures: ultrasound guided (picture in chart) Interscalene brachial plexus block Narrative:  Start time: 11/22/2014 10:49 AM End time: 11/22/2014 10:55 AM Injection made incrementally with aspirations every 5 mL.  Performed by: Personally  Anesthesiologist: CREWS, DAVID   Procedure Name: Intubation Date/Time: 11/22/2014 11:51 AM Performed by: Zenia ResidesPAYNE, Ashten Prats D Pre-anesthesia Checklist: Patient identified, Emergency Drugs available, Suction available and Patient being monitored Patient Re-evaluated:Patient Re-evaluated prior to inductionOxygen Delivery Method: Circle System Utilized Preoxygenation: Pre-oxygenation with 100% oxygen Intubation Type: IV induction Ventilation: Mask ventilation without difficulty Laryngoscope Size: Mac and 3 Grade View: Grade II Tube type: Oral Tube size: 7.0 mm Number of attempts: 1 Airway Equipment and Method: Stylet and Oral airway Placement Confirmation: ETT inserted through vocal cords under direct vision,  positive ETCO2 and breath sounds checked- equal and bilateral Secured at: 22 cm Tube secured with: Tape Dental Injury: Teeth and Oropharynx as per pre-operative assessment       Right ISB image

## 2014-11-22 NOTE — Op Note (Signed)
Procedure(s): Right open reduction internal fixation clavical fracture Procedure Note  Katrina HectorKathryn B Georgiades female 64 y.o. 11/22/2014  Procedure(s) and Anesthesia Type:    * Right open reduction internal fixation clavical fracture  - General  Surgeon(s) and Role:    * Jones BroomJustin Liara Holm, MD - Primary   Indications:  64 y.o. female s/p fall out of bed with right clavicle fracture. Indicated for surgery to promote anatomic restoration anatomy, improve functional outcome and avoid skin complications.     Surgeon: Mable ParisHANDLER,Lavell Supple WILLIAM   Assistants: Damita Lackanielle Lalibert PA-C [facility](Danielle was present and scrubbed throughout the procedure and was essential in positioning, retraction, exposure, and closure)  Anesthesia: General endotracheal anesthesia with preoperative interscalene block given by the attending anesthesiologist    Procedure Detail  Right open reduction internal fixation clavical fracture  Findings: Acumed long precontoured distal clavicle locking plate with 4 distal screws and 4 proximal screws. This spanned the area of comminution. There are several small comminuted fragments, too small to allow any interfragmentary screws.  Estimated Blood Loss:  less than 50 mL         Drains: none  Blood Given: none         Specimens: none        Complications:  * No complications entered in OR log *         Disposition: PACU - hemodynamically stable.         Condition: stable    Procedure:  DESCRIPTION OF PROCEDURE: The patient was identified in preoperative  holding area where I personally marked the operative site after  verifying site, side, and procedure with the patient. The patient was taken back  to the operating room where general anesthesia was induced without  complication and was placed in the beach-chair position with the back  elevated about 40 degrees and all extremities carefully padded and  positioned. The neck was turned very slightly away from the operative  field  to assist in exposure. The right upper extremity was then prepped and  draped in a standard sterile fashion. The appropriate time-out  procedure was carried out. The patient did receive IV antibiotics  within 30 minutes of incision.  An incision was made in [employer] lines centered over the fracture site. Dissection was carried down through subcutaneous tissues and medial and lateral skin flaps were elevated.  The deltotrapezial fascia was then opened over the clavicle and the  medial and lateral fracture fragments were carefully exposed, taking great care to protect underlying neurovascular structures.  Several small Butterfly fragments were kept in continuity with soft tissue as to not disrupt blood supply. Fragments were not amenable to interfragmentary screw fixation. The plate was positioned on the bone using fluoroscopic imaging to verify position. Locking and non locking screws were then used to fill the plate and flouroscopic imaging demonstrated appropriate position and screw lengths. The central comminution was spanned and was in reasonable alignment.  The wound was copiously irrigated with normal saline and the deltotrapezial fascia was  then carefully closed over the construct with #0 vicryl sutures in  interrupted fashion. The skin was then closed with 2-0 Vicryl in a deep  dermal layer, 4-0 Monocryl for skin closure. Steri-Strips were applied.  Sterile dressings were applied including a medium  Mepilex dressing. The patient was then allowed to awaken from general  anesthesia, placed in a sling, transferred to stretcher and taken to the  recovery room in stable condition.   POSTOPERATIVE PLAN: She will likely be discharged  home today with her husband. If she is having difficulty with pain control and recovery we can keep her overnight for observation. She'll follow-up in about 2 weeks for wound check. She will remain in a sling until a time.

## 2014-11-22 NOTE — Progress Notes (Signed)
Assisted Dr. Crews with right, ultrasound guided, interscalene  block. Side rails up, monitors on throughout procedure. See vital signs in flow sheet. Tolerated Procedure well. 

## 2014-11-22 NOTE — Transfer of Care (Signed)
Immediate Anesthesia Transfer of Care Note  Patient: Katrina Gray  Procedure(s) Performed: Procedure(s): Right open reduction internal fixation clavical fracture with reconstruction (Right)  Patient Location: PACU  Anesthesia Type:GA combined with regional for post-op pain  Level of Consciousness: awake and alert   Airway & Oxygen Therapy: Patient Spontanous Breathing and Patient connected to face mask oxygen  Post-op Assessment: Report given to RN and Post -op Vital signs reviewed and stable  Post vital signs: Reviewed and stable  Last Vitals:  Filed Vitals:   11/22/14 1100  BP: 140/69  Pulse: 100  Temp:   Resp: 12    Complications: No apparent anesthesia complications

## 2014-11-22 NOTE — H&P (Signed)
Adin HectorKathryn B Salatino is an 64 y.o. female.   Chief Complaint: R shoulder pain HPI: s/p fall with displaced comminuted distal clavicle fracture.  Past Medical History  Diagnosis Date  . Anxiety   . Hypertension     Past Surgical History  Procedure Laterality Date  . Abdominal hysterectomy    . Ovarian cyst surgery      History reviewed. No pertinent family history. Social History:  reports that she has never smoked. She has never used smokeless tobacco. She reports that she drinks alcohol. She reports that she does not use illicit drugs.  Allergies:  Allergies  Allergen Reactions  . Sulfa Antibiotics Anaphylaxis    "throat closes"  . Codeine Nausea Only    Medications Prior to Admission  Medication Sig Dispense Refill  . ALPRAZolam (XANAX) 0.5 MG tablet Take 0.25 mg by mouth at bedtime as needed for anxiety.    Marland Kitchen. escitalopram (LEXAPRO) 10 MG tablet Take 10 mg by mouth daily.    Marland Kitchen. estradiol (VIVELLE-DOT) 0.1 MG/24HR patch Place 1 patch onto the skin 2 (two) times a week.    Marland Kitchen. oxyCODONE-acetaminophen (PERCOCET/ROXICET) 5-325 MG tablet Take 1 tablet by mouth every 4 (four) hours as needed for severe pain. 15 tablet 0  . telmisartan (MICARDIS) 20 MG tablet Take 20 mg by mouth daily.      No results found for this or any previous visit (from the past 48 hour(s)). No results found.  Review of Systems  All other systems reviewed and are negative.   Blood pressure 140/69, pulse 100, temperature 98.4 F (36.9 C), temperature source Oral, resp. rate 12, height 5\' 5"  (1.651 m), weight 73.483 kg (162 lb), SpO2 96 %. Physical Exam  Constitutional: She is oriented to person, place, and time. She appears well-developed and well-nourished.  HENT:  Head: Atraumatic.  Eyes: EOM are normal.  Cardiovascular: Intact distal pulses.   Respiratory: Effort normal.  Musculoskeletal:  R shoulder skin intact.  TTP over prominent distal clavicle.  Neurological: She is alert and oriented to person,  place, and time.  Skin: Skin is warm and dry.  Psychiatric: She has a normal mood and affect.     Assessment/Plan R displaced comminuted distal clavicle fracture Plan ORIF Risks / benefits of surgery discussed Consent on chart  NPO for OR Preop antibiotics   Afrika Brick WILLIAM 11/22/2014, 11:30 AM

## 2014-11-22 NOTE — Anesthesia Postprocedure Evaluation (Signed)
   Anesthesia Post-op Note  Patient: Katrina HectorKathryn B Gray  Procedure(s) Performed: Procedure(s): Right open reduction internal fixation clavical fracture with reconstruction (Right)  Patient Location: PACU  Anesthesia Type: General, Regional   Level of Consciousness: awake, alert  and oriented  Airway and Oxygen Therapy: Patient Spontanous Breathing  Post-op Pain: none  Post-op Assessment: Post-op Vital signs reviewed  Post-op Vital Signs: Reviewed  Last Vitals:  Filed Vitals:   11/22/14 1400  BP: 130/80  Pulse: 112  Temp:   Resp: 12    Complications: No apparent anesthesia complications

## 2014-11-22 NOTE — Anesthesia Preprocedure Evaluation (Signed)
Anesthesia Evaluation  Patient identified by MRN, date of birth, ID band Patient awake    Reviewed: Allergy & Precautions, NPO status , Patient's Chart, lab work & pertinent test results  Airway Mallampati: I  TM Distance: >3 FB Neck ROM: Full    Dental  (+) Teeth Intact, Dental Advisory Given   Pulmonary  breath sounds clear to auscultation        Cardiovascular hypertension, Pt. on medications Rhythm:Regular Rate:Normal     Neuro/Psych    GI/Hepatic   Endo/Other    Renal/GU      Musculoskeletal   Abdominal   Peds  Hematology   Anesthesia Other Findings   Reproductive/Obstetrics                             Anesthesia Physical Anesthesia Plan  ASA: II  Anesthesia Plan: General and Regional   Post-op Pain Management:    Induction: Intravenous  Airway Management Planned: Oral ETT  Additional Equipment:   Intra-op Plan:   Post-operative Plan: Extubation in OR  Informed Consent: I have reviewed the patients History and Physical, chart, labs and discussed the procedure including the risks, benefits and alternatives for the proposed anesthesia with the patient or authorized representative who has indicated his/her understanding and acceptance.   Dental advisory given  Plan Discussed with: CRNA, Anesthesiologist and Surgeon  Anesthesia Plan Comments:         Anesthesia Quick Evaluation  

## 2014-11-22 NOTE — Discharge Instructions (Signed)
Discharge Instructions after Open Shoulder Repair ° °A sling has been provided for you. Remain in your sling at all times. This includes sleeping in your sling.  °Use ice on the shoulder intermittently over the first 48 hours after surgery.  °Pain medicine has been prescribed for you.  °Use your medicine liberally over the first 48 hours, and then you can begin to taper your use. You may take Extra Strength Tylenol or Tylenol only in place of the pain pills. DO NOT take ANY nonsteroidal anti-inflammatory pain medications: Advil, Motrin, Ibuprofen, Aleve, Naproxen or Naprosyn.  °You may remove your dressing after two days  °You may shower 5 days after surgery. The incisions CANNOT get wet prior to 5 days. Simply allow the water to wash over the site and then pat dry. Do not rub the incisions. Make sure your axilla (armpit) is completely dry after showering.  °Take one aspirin, a day for 2 weeks after surgery, unless you have an aspirin sensitivity/ allergy or asthma. ° ° °Please call [phone] during normal business hours or [phone] after hours for any problems. Including the following: ° °- excessive redness of the incisions °- drainage for more than 4 days °- fever of more than 101.5 F ° °*Please note that pain medications will not be refilled after hours or on weekends. ° ° °Post Anesthesia Home Care Instructions ° °Activity: °Get plenty of rest for the remainder of the day. A responsible adult should stay with you for 24 hours following the procedure.  °For the next 24 hours, DO NOT: °-Drive a car °-Operate machinery °-Drink alcoholic beverages °-Take any medication unless instructed by your physician °-Make any legal decisions or sign important papers. ° °Meals: °Start with liquid foods such as gelatin or soup. Progress to regular foods as tolerated. Avoid greasy, spicy, heavy foods. If nausea and/or vomiting occur, drink only clear liquids until the nausea and/or vomiting subsides. Call your physician  if vomiting continues. ° °Special Instructions/Symptoms: °Your throat may feel dry or sore from the anesthesia or the breathing tube placed in your throat during surgery. If this causes discomfort, gargle with warm salt water. The discomfort should disappear within 24 hours. ° °If you had a scopolamine patch placed behind your ear for the management of post- operative nausea and/or vomiting: ° °1. The medication in the patch is effective for 72 hours, after which it should be removed.  Wrap patch in a tissue and discard in the trash. Wash hands thoroughly with soap and water. °2. You may remove the patch earlier than 72 hours if you experience unpleasant side effects which may include dry mouth, dizziness or visual disturbances. °3. Avoid touching the patch. Wash your hands with soap and water after contact with the patch. °  °Regional Anesthesia Blocks ° °1. Numbness or the inability to move the "blocked" extremity may last from 3-48 hours after placement. The length of time depends on the medication injected and your individual response to the medication. If the numbness is not going away after 48 hours, call your surgeon. ° °2. The extremity that is blocked will need to be protected until the numbness is gone and the  Strength has returned. Because you cannot feel it, you will need to take extra care to avoid injury. Because it may be weak, you may have difficulty moving it or using it. You may not know what position it is in without looking at it while the block is in effect. ° °3. For blocks in   the legs and feet, returning to weight bearing and walking needs to be done carefully. You will need to wait until the numbness is entirely gone and the strength has returned. You should be able to move your leg and foot normally before you try and bear weight or walk. You will need someone to be with you when you first try to ensure you do not fall and possibly risk injury. ° °4. Bruising and tenderness at the needle  site are common side effects and will resolve in a few days. ° °5. Persistent numbness or new problems with movement should be communicated to the surgeon or the [facility] ([phone])/ [facility] ([phone]). °

## 2014-11-23 ENCOUNTER — Encounter (HOSPITAL_BASED_OUTPATIENT_CLINIC_OR_DEPARTMENT_OTHER): Payer: Self-pay | Admitting: Orthopedic Surgery

## 2015-03-29 ENCOUNTER — Other Ambulatory Visit: Payer: Self-pay

## 2015-03-29 DIAGNOSIS — Z1231 Encounter for screening mammogram for malignant neoplasm of breast: Secondary | ICD-10-CM

## 2015-04-14 ENCOUNTER — Ambulatory Visit
Admission: RE | Admit: 2015-04-14 | Discharge: 2015-04-14 | Disposition: A | Discharge status: Home / Self Care | Payer: BC Managed Care – PPO | Source: Ambulatory Visit

## 2015-04-14 ENCOUNTER — Ambulatory Visit: Payer: BC Managed Care – PPO

## 2015-04-14 DIAGNOSIS — Z1231 Encounter for screening mammogram for malignant neoplasm of breast: Secondary | ICD-10-CM

## 2015-04-20 ENCOUNTER — Other Ambulatory Visit: Payer: Self-pay | Admitting: Orthopedic Surgery

## 2015-04-20 ENCOUNTER — Ambulatory Visit
Admission: RE | Admit: 2015-04-20 | Discharge: 2015-04-20 | Disposition: A | Discharge status: Home / Self Care | Payer: BC Managed Care – PPO | Source: Ambulatory Visit | Attending: Orthopedic Surgery | Admitting: Orthopedic Surgery

## 2015-04-20 DIAGNOSIS — Z09 Encounter for follow-up examination after completed treatment for conditions other than malignant neoplasm: Secondary | ICD-10-CM

## 2015-05-02 ENCOUNTER — Other Ambulatory Visit: Payer: Self-pay | Admitting: Orthopedic Surgery

## 2015-05-02 ENCOUNTER — Encounter (HOSPITAL_COMMUNITY): Payer: Self-pay | Admitting: *Deleted

## 2015-05-02 MED ORDER — CEFAZOLIN SODIUM-DEXTROSE 2-4 GM/100ML-% IV SOLN
2.0000 g | INTRAVENOUS | Status: AC
  Administered 2015-05-03: 2 g via INTRAVENOUS
  Filled 2015-05-02: qty 100

## 2015-05-02 NOTE — Progress Notes (Signed)
Pt denies cardiac history, chest pain or sob. 

## 2015-05-03 ENCOUNTER — Ambulatory Visit (HOSPITAL_COMMUNITY): Payer: BC Managed Care – PPO | Admitting: Anesthesiology

## 2015-05-03 ENCOUNTER — Ambulatory Visit (HOSPITAL_COMMUNITY)
Admission: RE | Admit: 2015-05-03 | Discharge: 2015-05-03 | Disposition: A | Discharge status: Home / Self Care | Payer: BC Managed Care – PPO | Source: Ambulatory Visit | Attending: Orthopedic Surgery | Admitting: Orthopedic Surgery

## 2015-05-03 ENCOUNTER — Ambulatory Visit (HOSPITAL_COMMUNITY): Payer: BC Managed Care – PPO

## 2015-05-03 ENCOUNTER — Encounter (HOSPITAL_COMMUNITY)
Admission: RE | Disposition: A | Discharge status: Home / Self Care | Payer: Self-pay | Source: Ambulatory Visit | Attending: Orthopedic Surgery

## 2015-05-03 DIAGNOSIS — I10 Essential (primary) hypertension: Secondary | ICD-10-CM | POA: Diagnosis not present

## 2015-05-03 DIAGNOSIS — F419 Anxiety disorder, unspecified: Secondary | ICD-10-CM | POA: Insufficient documentation

## 2015-05-03 DIAGNOSIS — Z79890 Hormone replacement therapy (postmenopausal): Secondary | ICD-10-CM | POA: Diagnosis not present

## 2015-05-03 DIAGNOSIS — Z7982 Long term (current) use of aspirin: Secondary | ICD-10-CM | POA: Diagnosis not present

## 2015-05-03 DIAGNOSIS — S42031K Displaced fracture of lateral end of right clavicle, subsequent encounter for fracture with nonunion: Secondary | ICD-10-CM | POA: Diagnosis present

## 2015-05-03 DIAGNOSIS — Z419 Encounter for procedure for purposes other than remedying health state, unspecified: Secondary | ICD-10-CM

## 2015-05-03 DIAGNOSIS — X58XXXD Exposure to other specified factors, subsequent encounter: Secondary | ICD-10-CM | POA: Insufficient documentation

## 2015-05-03 HISTORY — DX: Headache, unspecified: R51.9

## 2015-05-03 HISTORY — PX: ORIF CLAVICULAR FRACTURE: SHX5055

## 2015-05-03 HISTORY — DX: Pneumonia, unspecified organism: J18.9

## 2015-05-03 HISTORY — DX: Headache: R51

## 2015-05-03 LAB — CBC
HEMATOCRIT: 41 % (ref 36.0–46.0)
HEMOGLOBIN: 13.8 g/dL (ref 12.0–15.0)
MCH: 30.3 pg (ref 26.0–34.0)
MCHC: 33.7 g/dL (ref 30.0–36.0)
MCV: 90.1 fL (ref 78.0–100.0)
Platelets: 253 10*3/uL (ref 150–400)
RBC: 4.55 MIL/uL (ref 3.87–5.11)
RDW: 13.8 % (ref 11.5–15.5)
WBC: 6.4 10*3/uL (ref 4.0–10.5)

## 2015-05-03 LAB — BASIC METABOLIC PANEL
ANION GAP: 7 (ref 5–15)
BUN: 14 mg/dL (ref 6–20)
CALCIUM: 9.6 mg/dL (ref 8.9–10.3)
CHLORIDE: 108 mmol/L (ref 101–111)
CO2: 25 mmol/L (ref 22–32)
CREATININE: 0.76 mg/dL (ref 0.44–1.00)
GFR calc Af Amer: >60 mL/min (ref >60)
GLUCOSE: 91 mg/dL (ref 65–99)
POTASSIUM: 4.1 mmol/L (ref 3.5–5.1)
Sodium: 140 mmol/L (ref 135–145)

## 2015-05-03 SURGERY — OPEN REDUCTION INTERNAL FIXATION (ORIF) CLAVICULAR FRACTURE
Anesthesia: General | Laterality: Right

## 2015-05-03 MED ORDER — FENTANYL CITRATE (PF) 100 MCG/2ML IJ SOLN
INTRAMUSCULAR | Status: AC
  Administered 2015-05-03: 100 ug
  Filled 2015-05-03: qty 2

## 2015-05-03 MED ORDER — MEPERIDINE HCL 25 MG/ML IJ SOLN
6.2500 mg | INTRAMUSCULAR | Status: DC | PRN
Start: 2015-05-03 — End: 2015-05-03

## 2015-05-03 MED ORDER — MIDAZOLAM HCL 2 MG/2ML IJ SOLN
INTRAMUSCULAR | Status: AC
  Administered 2015-05-03: 2 mg
  Filled 2015-05-03: qty 2

## 2015-05-03 MED ORDER — LIDOCAINE HCL (CARDIAC) 20 MG/ML IV SOLN
INTRAVENOUS | Status: DC | PRN
  Administered 2015-05-03: 80 mg via INTRAVENOUS

## 2015-05-03 MED ORDER — PHENYLEPHRINE HCL 10 MG/ML IJ SOLN
INTRAMUSCULAR | Status: AC
  Filled 2015-05-03: qty 1

## 2015-05-03 MED ORDER — PHENYLEPHRINE HCL 10 MG/ML IJ SOLN
10.0000 mg | INTRAVENOUS | Status: DC | PRN
  Administered 2015-05-03: 50 ug/min via INTRAVENOUS

## 2015-05-03 MED ORDER — ROCURONIUM BROMIDE 100 MG/10ML IV SOLN
INTRAVENOUS | Status: DC | PRN
  Administered 2015-05-03: 40 mg via INTRAVENOUS

## 2015-05-03 MED ORDER — SODIUM CHLORIDE 0.9 % IR SOLN
Status: DC | PRN
  Administered 2015-05-03: 1000 mL

## 2015-05-03 MED ORDER — PROPOFOL 10 MG/ML IV BOLUS
INTRAVENOUS | Status: DC | PRN
  Administered 2015-05-03: 160 mg via INTRAVENOUS

## 2015-05-03 MED ORDER — BUPIVACAINE-EPINEPHRINE (PF) 0.25% -1:200000 IJ SOLN
INTRAMUSCULAR | Status: AC
  Filled 2015-05-03: qty 30

## 2015-05-03 MED ORDER — ONDANSETRON HCL 4 MG/2ML IJ SOLN: 4.0000 mg | Freq: Once | INTRAMUSCULAR | Status: DC | PRN

## 2015-05-03 MED ORDER — MIDAZOLAM HCL 5 MG/5ML IJ SOLN
INTRAMUSCULAR | Status: DC | PRN
  Administered 2015-05-03 (×2): 1 mg via INTRAVENOUS

## 2015-05-03 MED ORDER — BUPIVACAINE-EPINEPHRINE 0.25% -1:200000 IJ SOLN
INTRAMUSCULAR | Status: DC | PRN
  Administered 2015-05-03: 10 mL

## 2015-05-03 MED ORDER — SODIUM CHLORIDE 0.9 % IJ SOLN
INTRAMUSCULAR | Status: AC
  Filled 2015-05-03: qty 10

## 2015-05-03 MED ORDER — NEOSTIGMINE METHYLSULFATE 10 MG/10ML IV SOLN
INTRAVENOUS | Status: AC
  Filled 2015-05-03: qty 1

## 2015-05-03 MED ORDER — EPHEDRINE SULFATE 50 MG/ML IJ SOLN
INTRAMUSCULAR | Status: AC
  Filled 2015-05-03: qty 1

## 2015-05-03 MED ORDER — POVIDONE-IODINE 7.5 % EX SOLN
Freq: Once | CUTANEOUS | Status: DC
  Filled 2015-05-03: qty 118

## 2015-05-03 MED ORDER — FENTANYL CITRATE (PF) 250 MCG/5ML IJ SOLN
INTRAMUSCULAR | Status: AC
  Filled 2015-05-03: qty 5

## 2015-05-03 MED ORDER — NEOSTIGMINE METHYLSULFATE 10 MG/10ML IV SOLN
INTRAVENOUS | Status: DC | PRN
  Administered 2015-05-03: 3 mg via INTRAVENOUS

## 2015-05-03 MED ORDER — MIDAZOLAM HCL 2 MG/2ML IJ SOLN
INTRAMUSCULAR | Status: AC
  Filled 2015-05-03: qty 2

## 2015-05-03 MED ORDER — DOCUSATE SODIUM 100 MG PO CAPS: 100.0000 mg | ORAL_CAPSULE | Freq: Three times a day (TID) | ORAL | Status: AC | PRN

## 2015-05-03 MED ORDER — FENTANYL CITRATE (PF) 100 MCG/2ML IJ SOLN
INTRAMUSCULAR | Status: DC | PRN
  Administered 2015-05-03 (×2): 50 ug via INTRAVENOUS

## 2015-05-03 MED ORDER — GLYCOPYRROLATE 0.2 MG/ML IJ SOLN
INTRAMUSCULAR | Status: AC
  Filled 2015-05-03: qty 1

## 2015-05-03 MED ORDER — LACTATED RINGERS IV SOLN
INTRAVENOUS | Status: DC | PRN
  Administered 2015-05-03 (×2): via INTRAVENOUS

## 2015-05-03 MED ORDER — GLYCOPYRROLATE 0.2 MG/ML IJ SOLN
INTRAMUSCULAR | Status: DC | PRN
  Administered 2015-05-03: 0.4 mg via INTRAVENOUS

## 2015-05-03 MED ORDER — OXYCODONE-ACETAMINOPHEN 5-325 MG PO TABS: 1.0000 | ORAL_TABLET | ORAL | Status: AC | PRN

## 2015-05-03 MED ORDER — HYDROMORPHONE HCL 1 MG/ML IJ SOLN: 0.2500 mg | INTRAMUSCULAR | Status: DC | PRN

## 2015-05-03 MED ORDER — LACTATED RINGERS IV SOLN
INTRAVENOUS | Status: DC
  Administered 2015-05-03: 11:00:00 via INTRAVENOUS

## 2015-05-03 MED ORDER — PROPOFOL 10 MG/ML IV BOLUS
INTRAVENOUS | Status: AC
  Filled 2015-05-03: qty 20

## 2015-05-03 MED ORDER — ONDANSETRON HCL 4 MG/2ML IJ SOLN
INTRAMUSCULAR | Status: DC | PRN
  Administered 2015-05-03: 4 mg via INTRAVENOUS

## 2015-05-03 SURGICAL SUPPLY — 74 items
ALLEN UNIVERSAL HEAD RESTRAINT ×3 IMPLANT
BIT DRILL 2.3 QUICK RELEASE (BIT) ×1 IMPLANT
BIT DRILL 2.8X5 QR DISP (BIT) ×3 IMPLANT
BIT DRILL 3.0X5 QUICK RELEASE (DRILL) ×1 IMPLANT
CHLORAPREP W/TINT 26ML (MISCELLANEOUS) ×3 IMPLANT
CLOSURE WOUND 1/2 X4 (GAUZE/BANDAGES/DRESSINGS) ×1
COVER SURGICAL LIGHT HANDLE (MISCELLANEOUS) ×3 IMPLANT
DRAPE C-ARM 42X72 X-RAY (DRAPES) ×3 IMPLANT
DRAPE IMP U-DRAPE 54X76 (DRAPES) ×3 IMPLANT
DRAPE INCISE IOBAN 66X45 STRL (DRAPES) ×3 IMPLANT
DRAPE PROXIMA HALF (DRAPES) ×3 IMPLANT
DRAPE SURG 17X23 STRL (DRAPES) ×3 IMPLANT
DRAPE U-SHAPE 47X51 STRL (DRAPES) ×3 IMPLANT
DRILL 2.3 QUICK RELEASE (BIT) ×3
DRILL 3.0X5 QUICK RELEASE (DRILL) ×3
DRSG TEGADERM 4X4.75 (GAUZE/BANDAGES/DRESSINGS) ×9 IMPLANT
ELECT REM PT RETURN 9FT ADLT (ELECTROSURGICAL)
ELECTRODE REM PT RTRN 9FT ADLT (ELECTROSURGICAL) IMPLANT
GLOVE BIO SURGEON STRL SZ7 (GLOVE) ×3 IMPLANT
GLOVE BIO SURGEON STRL SZ7.5 (GLOVE) ×3 IMPLANT
GLOVE BIOGEL PI IND STRL 6.5 (GLOVE) ×1 IMPLANT
GLOVE BIOGEL PI IND STRL 7.0 (GLOVE) ×2 IMPLANT
GLOVE BIOGEL PI IND STRL 8 (GLOVE) ×1 IMPLANT
GLOVE BIOGEL PI INDICATOR 6.5 (GLOVE) ×2
GLOVE BIOGEL PI INDICATOR 7.0 (GLOVE) ×4
GLOVE BIOGEL PI INDICATOR 8 (GLOVE) ×2
GLOVE ECLIPSE 6.0 STRL STRAW (GLOVE) ×3 IMPLANT
GLOVE SURG SS PI 7.0 STRL IVOR (GLOVE) ×3 IMPLANT
GOWN STRL REUS W/ TWL LRG LVL3 (GOWN DISPOSABLE) ×3 IMPLANT
GOWN STRL REUS W/ TWL XL LVL3 (GOWN DISPOSABLE) ×1 IMPLANT
GOWN STRL REUS W/TWL LRG LVL3 (GOWN DISPOSABLE) ×6
GOWN STRL REUS W/TWL XL LVL3 (GOWN DISPOSABLE) ×5 IMPLANT
KIT BASIN OR (CUSTOM PROCEDURE TRAY) ×3 IMPLANT
KIT BIO-TENODESIS 3X8 DISP (MISCELLANEOUS)
KIT INFUSE X SMALL 1.4CC (Orthopedic Implant) ×3 IMPLANT
KIT INSRT BABSR STRL DISP BTN (MISCELLANEOUS) IMPLANT
KIT ROOM TURNOVER OR (KITS) ×3 IMPLANT
MANIFOLD NEPTUNE WASTE (CANNULA) ×3 IMPLANT
NEEDLE HYPO 25GX1X1/2 BEV (NEEDLE) ×3 IMPLANT
NEEDLE SPNL 18GX3.5 QUINCKE PK (NEEDLE) ×3 IMPLANT
NS IRRIG 1000ML POUR BTL (IV SOLUTION) ×3 IMPLANT
PACK SHOULDER (CUSTOM PROCEDURE TRAY) ×3 IMPLANT
PACK UNIVERSAL I (CUSTOM PROCEDURE TRAY) ×3 IMPLANT
PAD ARMBOARD 7.5X6 YLW CONV (MISCELLANEOUS) ×6 IMPLANT
PLATE CLAVICLE LEFT 8 HOLE (Plate) ×3 IMPLANT
RETRIEVER SUT HEWSON (MISCELLANEOUS) IMPLANT
SCREW CANC 16MM (Screw) ×3 IMPLANT
SCREW HEXALOBE LOCKING 3.5X16M (Screw) ×3 IMPLANT
SCREW HEXALOBE NON-LOCK 3.5X14 (Screw) ×6 IMPLANT
SCREW HEXALOBE NON-LOCK 3.5X16 (Screw) ×6 IMPLANT
SCREW LOCK 12X3.5X HEXALOBE (Screw) ×2 IMPLANT
SCREW LOCKING 3.5X12 (Screw) ×4 IMPLANT
SCREW NON LOCK 3.0X12 (Screw) ×3 IMPLANT
SCREW NONLOCK HEX 3.5X12 (Screw) ×3 IMPLANT
SLING ARM FOAM STRAP MED (SOFTGOODS) ×3 IMPLANT
SPONGE GAUZE 4X4 12PLY STER LF (GAUZE/BANDAGES/DRESSINGS) ×3 IMPLANT
STRIP CLOSURE SKIN 1/2X4 (GAUZE/BANDAGES/DRESSINGS) ×2 IMPLANT
STRYKER PRECISION THIN ×2 IMPLANT
SUCTION FRAZIER HANDLE 10FR (MISCELLANEOUS) ×2
SUCTION TUBE FRAZIER 10FR DISP (MISCELLANEOUS) ×1 IMPLANT
SUT FIBERWIRE #2 38 T-5 BLUE (SUTURE)
SUT MON AB 4-0 PC3 18 (SUTURE) ×3 IMPLANT
SUT PDS AB 1 CT  36 (SUTURE)
SUT PDS AB 1 CT 36 (SUTURE) IMPLANT
SUT VIC AB 0 CT1 27 (SUTURE) ×4
SUT VIC AB 0 CT1 27XBRD ANBCTR (SUTURE) ×2 IMPLANT
SUT VIC AB 2-0 CT1 27 (SUTURE) ×4
SUT VIC AB 2-0 CT1 TAPERPNT 27 (SUTURE) ×2 IMPLANT
SUT VICRYL 0 CT 1 36IN (SUTURE) IMPLANT
SUTURE FIBERWR #2 38 T-5 BLUE (SUTURE) IMPLANT
SYR CONTROL 10ML LL (SYRINGE) ×6 IMPLANT
TOWEL OR 17X24 6PK STRL BLUE (TOWEL DISPOSABLE) ×3 IMPLANT
TOWEL OR 17X26 10 PK STRL BLUE (TOWEL DISPOSABLE) ×3 IMPLANT
WATER STERILE IRR 1000ML POUR (IV SOLUTION) ×3 IMPLANT

## 2015-05-03 NOTE — Discharge Instructions (Signed)

## 2015-05-03 NOTE — Transfer of Care (Signed)
Immediate Anesthesia Transfer of Care Note  Patient: Katrina HectorKathryn B Gray  Procedure(s) Performed: Procedure(s) with comments: REVISION OPEN REDUCTION INTERNAL FIXATION (ORIF) CLAVICULAR FRACTURE (Right) - RIGHT REVISION OPEN REDUCTION INTERNAL FIXATION (ORIF) CLAVICULAR FRACTURE  Patient Location: PACU  Anesthesia Type:General and GA combined with regional for post-op pain  Level of Consciousness: awake, alert , oriented and patient cooperative  Airway & Oxygen Therapy: Patient Spontanous Breathing and Patient connected to nasal cannula oxygen  Post-op Assessment: Report given to RN, Post -op Vital signs reviewed and stable and Patient moving all extremities  Post vital signs: Reviewed and stable  Last Vitals:  Filed Vitals:   05/03/15 1220 05/03/15 1557  BP: 128/81   Pulse: 88   Temp:  37 C  Resp: 15     Complications: No apparent anesthesia complications

## 2015-05-03 NOTE — Anesthesia Preprocedure Evaluation (Addendum)
Anesthesia Evaluation  Patient identified by MRN, date of birth, ID band Patient awake    Reviewed: Allergy & Precautions, NPO status , Patient's Chart, lab work & pertinent test results  Airway Mallampati: I  TM Distance: >3 FB Neck ROM: Full    Dental  (+) Teeth Intact, Dental Advisory Given   Pulmonary    Pulmonary exam normal        Cardiovascular hypertension, Pt. on medications Normal cardiovascular exam     Neuro/Psych Anxiety    GI/Hepatic   Endo/Other    Renal/GU      Musculoskeletal   Abdominal   Peds  Hematology   Anesthesia Other Findings   Reproductive/Obstetrics                            Anesthesia Physical Anesthesia Plan  ASA: II  Anesthesia Plan: General   Post-op Pain Management: GA combined w/ Regional for post-op pain   Induction: Intravenous  Airway Management Planned: Oral ETT  Additional Equipment:   Intra-op Plan:   Post-operative Plan: Extubation in OR  Informed Consent: I have reviewed the patients History and Physical, chart, labs and discussed the procedure including the risks, benefits and alternatives for the proposed anesthesia with the patient or authorized representative who has indicated his/her understanding and acceptance.   Dental advisory given  Plan Discussed with: CRNA, Surgeon and Anesthesiologist  Anesthesia Plan Comments:        Anesthesia Quick Evaluation

## 2015-05-03 NOTE — H&P (Signed)
Katrina Gray is an 65 y.o. female.   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  Past Medical History  Diagnosis Date  . Anxiety   . Hypertension   . Pneumonia   . Headache     hx of migraines prior to hysterectomy, now allergy related headaches    Past Surgical History  Procedure Laterality Date  . Abdominal hysterectomy    . Ovarian cyst surgery    . Orif clavicular fracture Right 11/22/2014    Procedure: Right open reduction internal fixation clavical fracture with reconstruction;  Surgeon: JTania Ade MD;  [place] [facility]  Service: Orthopedics;  Laterality: Right;  . Pilonidal cyst excision    . Colonoscopy      Family History  Problem Relation Age of Onset  . Stomach cancer Father   . Esophageal cancer Father    Social History:  reports that she has never smoked. She has never used smokeless tobacco. She reports that she drinks about 4.2 oz of alcohol per week. She reports that she does not use illicit drugs.  Allergies:  Allergies  Allergen Reactions  . Sulfa Antibiotics Anaphylaxis    "throat closes"  . Codeine Nausea Only    Medications Prior to Admission  Medication Sig Dispense Refill  . ALPRAZolam (XANAX) 0.5 MG tablet Take 0.25 mg by mouth at bedtime as needed for anxiety.    .Marland Kitchenaspirin-acetaminophen-caffeine (EXCEDRIN MIGRAINE) 250-250-65 MG tablet Take 1-2 tablets by mouth every 6 (six) hours as needed for headache.    . Calcium Carb-Cholecalciferol (CALTRATE 600+D3 SOFT PO) Take 1 tablet by mouth 2 (two) times daily.     .Marland Kitchenescitalopram (LEXAPRO) 10 MG tablet Take 10 mg by mouth daily.    .Marland Kitchenestradiol (VIVELLE-DOT) 0.1 MG/24HR patch Place 1 patch onto the skin 2 (two) times a week.    . valsartan (DIOVAN) 160 MG tablet Take 160 mg by mouth daily.      Results for orders placed or performed during the hospital encounter of 05/03/15 (from the past 48 hour(s))  CBC     Status: None    Collection Time: 05/03/15 10:42 AM  Result Value Ref Range   WBC 6.4 4.0 - 10.5 K/uL   RBC 4.55 3.87 - 5.11 MIL/uL   Hemoglobin 13.8 12.0 - 15.0 g/dL   HCT 41.0 36.0 - 46.0 %   MCV 90.1 78.0 - 100.0 fL   MCH 30.3 26.0 - 34.0 pg   MCHC 33.7 30.0 - 36.0 g/dL   RDW 13.8 11.5 - 15.5 %   Platelets 253 150 - 400 K/uL  Basic metabolic panel     Status: None   Collection Time: 05/03/15 10:42 AM  Result Value Ref Range   Sodium 140 135 - 145 mmol/L   Potassium 4.1 3.5 - 5.1 mmol/L   Chloride 108 101 - 111 mmol/L   CO2 25 22 - 32 mmol/L   Glucose, Bld 91 65 - 99 mg/dL   BUN 14 6 - 20 mg/dL   Creatinine, Ser 0.76 0.44 - 1.00 mg/dL   Calcium 9.6 8.9 - 10.3 mg/dL   GFR calc non Af Amer >60 >60 mL/min   GFR calc Af Amer >60 >60 mL/min    Comment: (NOTE) The eGFR has been calculated using the CKD EPI equation. This calculation has not been validated in all clinical situations. eGFR's persistently <60 mL/min signify possible Chronic Kidney Disease.    Anion gap 7 5 -  15   No results found.  Review of Systems  All other systems reviewed and are negative.   Blood pressure 128/81, pulse 88, temperature 98.3 F (36.8 C), temperature source Oral, resp. rate 15, height 5' 5.5" (1.664 m), weight 70.308 kg (155 lb), SpO2 99 %. Physical Exam  Constitutional: She is oriented to person, place, and time. She appears well-developed and well-nourished.  HENT:  Head: Atraumatic.  Eyes: EOM are normal.  Cardiovascular: Intact distal pulses.   Respiratory: Effort normal.  Musculoskeletal:  R shoulder TTP over clavicle site. Incision well healed.  Neurological: She is alert and oriented to person, place, and time.  Skin: Skin is warm and dry.  Psychiatric: She has a normal mood and affect.     Assessment/Plan R clavicle nonunion Plan R clavicle revision ORIF with compression plating, grafting and BMP Risks / benefits of surgery discussed Consent on chart  NPO for OR Preop  antibiotics   Nita Sells, MD 05/03/2015, 1:04 PM

## 2015-05-03 NOTE — Anesthesia Procedure Notes (Addendum)
Procedure Name: Intubation Date/Time: 05/03/2015 1:35 PM Performed by: Rogelia BogaMUELLER, Darcus Edds P Pre-anesthesia Checklist: Patient identified, Emergency Drugs available, Suction available and Patient being monitored Patient Re-evaluated:Patient Re-evaluated prior to inductionOxygen Delivery Method: Circle system utilized Preoxygenation: Pre-oxygenation with 100% oxygen Intubation Type: IV induction Ventilation: Mask ventilation without difficulty and Oral airway inserted - appropriate to patient size Laryngoscope Size: Mac and 2 Grade View: Grade II Tube type: Oral Tube size: 7.0 mm Number of attempts: 1 Airway Equipment and Method: Stylet Placement Confirmation: ETT inserted through vocal cords under direct vision,  breath sounds checked- equal and bilateral and positive ETCO2 Secured at: 24 cm Tube secured with: Tape Dental Injury: Teeth and Oropharynx as per pre-operative assessment  Comments: Intubation performed by Kevan NyShannon McLoughlin, SRNA

## 2015-05-03 NOTE — Op Note (Signed)
Procedure(s):  JERRIKA LEDLOW female 65 y.o. 05/03/2015  Procedure(s) and Anesthesia Type:    * REVISION OPEN REDUCTION INTERNAL FIXATION (ORIF) CLAVICULAR FRACTURE NONUNION - General  Surgeon(s) and Role:    * Jones Broom, MD - Primary   Indications:  65 y.o. female s/p ORIF right distal comminuted clavicle fracture with subsequent nonunion. Indicated for surgical treatment to try and promote bony union.     Surgeon: Mable Paris   Assistants: Damita Lack PA-C [facility] was present and scrubbed throughout the procedure and was essential in positioning, retraction, exposure, and closure)  Anesthesia: General endotracheal anesthesia with preoperative interscalene block given by the attending anesthesiologist    Procedure Detail   Findings: Fracture was indeed nonunited. The bone ends were extremely thin superior to inferior. Fracture ends were freshened with a saw to create oblique superior and inferior surfaces that then opposed each other with about 1 cm shortening and overlap. Compression was applied through the plate and with a 3-0 interfragmentary screw through the plate. BMP was used at the fracture site along with local bone grafting.  Estimated Blood Loss:  Minimal         Drains: none  Blood Given: none         Specimens: none        Complications:  * No complications entered in OR log *         Disposition: PACU - hemodynamically stable.         Condition: stable    Procedure:  DESCRIPTION OF PROCEDURE: The patient was identified in preoperative  holding area where I personally marked the operative site after  verifying site, side, and procedure with the patient. The patient was taken back  to the operating room where general anesthesia was induced without  complication and was placed in the beach-chair position with the back  elevated about 40 degrees and all extremities carefully padded and  positioned. The neck was turned very  slightly away from the operative field  to assist in exposure. The right upper extremity was then prepped and  draped in a standard sterile fashion. The appropriate time-out  procedure was carried out. The patient did receive IV antibiotics  within 30 minutes of incision.  An incision was made in [employer] centered over the fracture site through the previous incision. Dissection was carried down through subcutaneous tissues and medial and lateral skin flaps were elevated.  The deltotrapezial fascia was then opened over the clavicle and the plate was exposed. Plate and screws were grossly intact. All the screws in the plate were removed. There is no gross loosening. Fracture site was carefully exposed and debrided. There is no areas of union. The proximal and distal fragments were carefully exposed. There was a small bony excrescence anteriorly off the medial fragment which appeared to be likely partially healed butterfly fragment. This was removed and morcellized for bone graft. The medial fragment was noted to lack any inferior cortex for a length of about 1.5 cm and the lateral fragment was also noted to be extremely thin at the fracture site without obvious bicortical segment. Therefore I felt that in order to best promote healing it would be best overlap these segments and slightly shorten through the fracture to allow overlap of these 2 extremely thin sections and allow some compression and an interfragmentary screw. Therefore an oscillating saw was used to freshen these surfaces on the superior aspect of the lateral segment and inferior aspect of the medial segment.  A plate was then first fixed laterally with one nonlocking and 2 locking screws using 1 4-0 rescue screw due to stripping with acceptable fixation. Once lateral segment was fixed the medial segment was reduced into the axilla that was created and manual compression was applied while a medial nonlocking screw was placed in a compression  configuration. This allowed for good compression at the fracture site and the opposed surfaces. An interfragmentary 3-0 cortical screw was placed through the plate at the fracture site to allow additional compression. Additional locking and nonlocking screws were then placed medial. Final fluoroscopic imaging showed anatomic reduction of the fracture with appropriate positioning of plate and screws. This point copious irrigation was used and then the morcellized bone graft was placed around the fracture site along with an extra small infuse pack which was placed on all visible surfaces of the fracture site.  The deltotrapezial fascia was  then carefully closed over the construct with #0 vicryl sutures in  interrupted fashion. The skin was then closed with 2-0 Vicryl in a deep  dermal layer, 4-0 Monocryl for skin closure. Steri-Strips were applied.  10 mL of 0.25% Marcaine with epinephrine were infiltrated for  postoperative pain. Sterile dressings were applied including a medium  Mepilex dressing. The patient was then allowed to awaken from general  anesthesia, placed in a sling, transferred to stretcher and taken to the  recovery room in stable condition.   POSTOPERATIVE PLAN: She will be observed in the recovery room and if pain is well-controlled she can be discharged home with her family. If not she will be observed overnight for pain control. She will remain in her sling and follow-up with in 2 to 3 weeks. We will keep her in her sling without any shoulder movement for 6 weeks prior to moving shoulder.

## 2015-05-04 NOTE — Anesthesia Postprocedure Evaluation (Signed)
Anesthesia Post Note  Patient: Katrina HectorKathryn B Gray  Procedure(s) Performed: Procedure(s) (LRB): REVISION OPEN REDUCTION INTERNAL FIXATION (ORIF) CLAVICULAR FRACTURE (Right)  Patient location during evaluation: PACU Anesthesia Type: General Level of consciousness: awake and alert and patient cooperative Pain management: pain level controlled Vital Signs Assessment: post-procedure vital signs reviewed and stable Respiratory status: spontaneous breathing and respiratory function stable Cardiovascular status: stable Anesthetic complications: no    Last Vitals:  Filed Vitals:   05/03/15 1630 05/03/15 1645  BP: 108/74 103/75  Pulse: 78 78  Temp:  36.7 C  Resp: 12 16    Last Pain:  Filed Vitals:   05/03/15 1649  PainSc: 0-No pain                 Toluwanimi Radebaugh S

## 2015-05-05 ENCOUNTER — Encounter (HOSPITAL_COMMUNITY): Payer: Self-pay | Admitting: Orthopedic Surgery

## 2016-10-18 IMAGING — DX DG SHOULDER 2+V*R*
2 series · 2 of 2 positions shown · non-contrast
Comparison: None.

CLINICAL DATA: Fell out of bed last night. Severe right shoulder
and clavicle pain.

EXAM:
RIGHT SHOULDER - 2+ VIEW

[shoulder grashey]
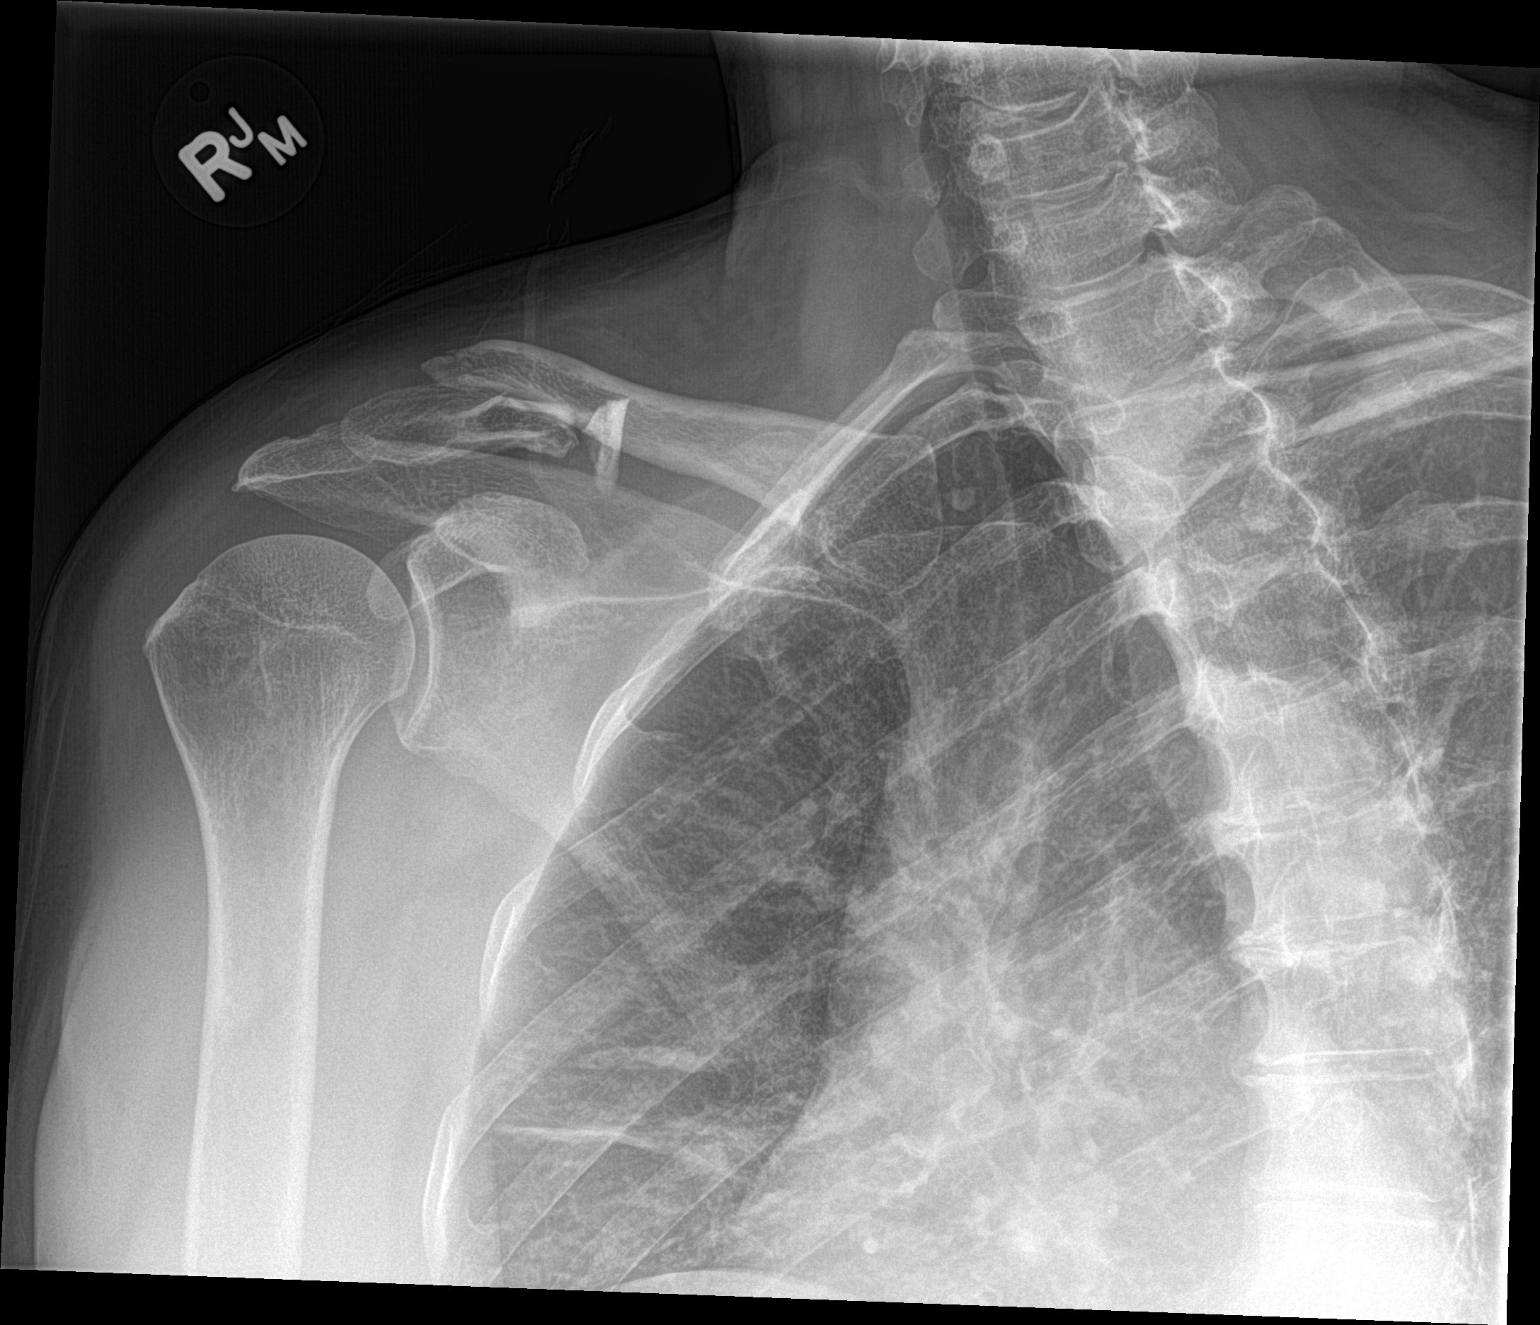

[shoulder y view]
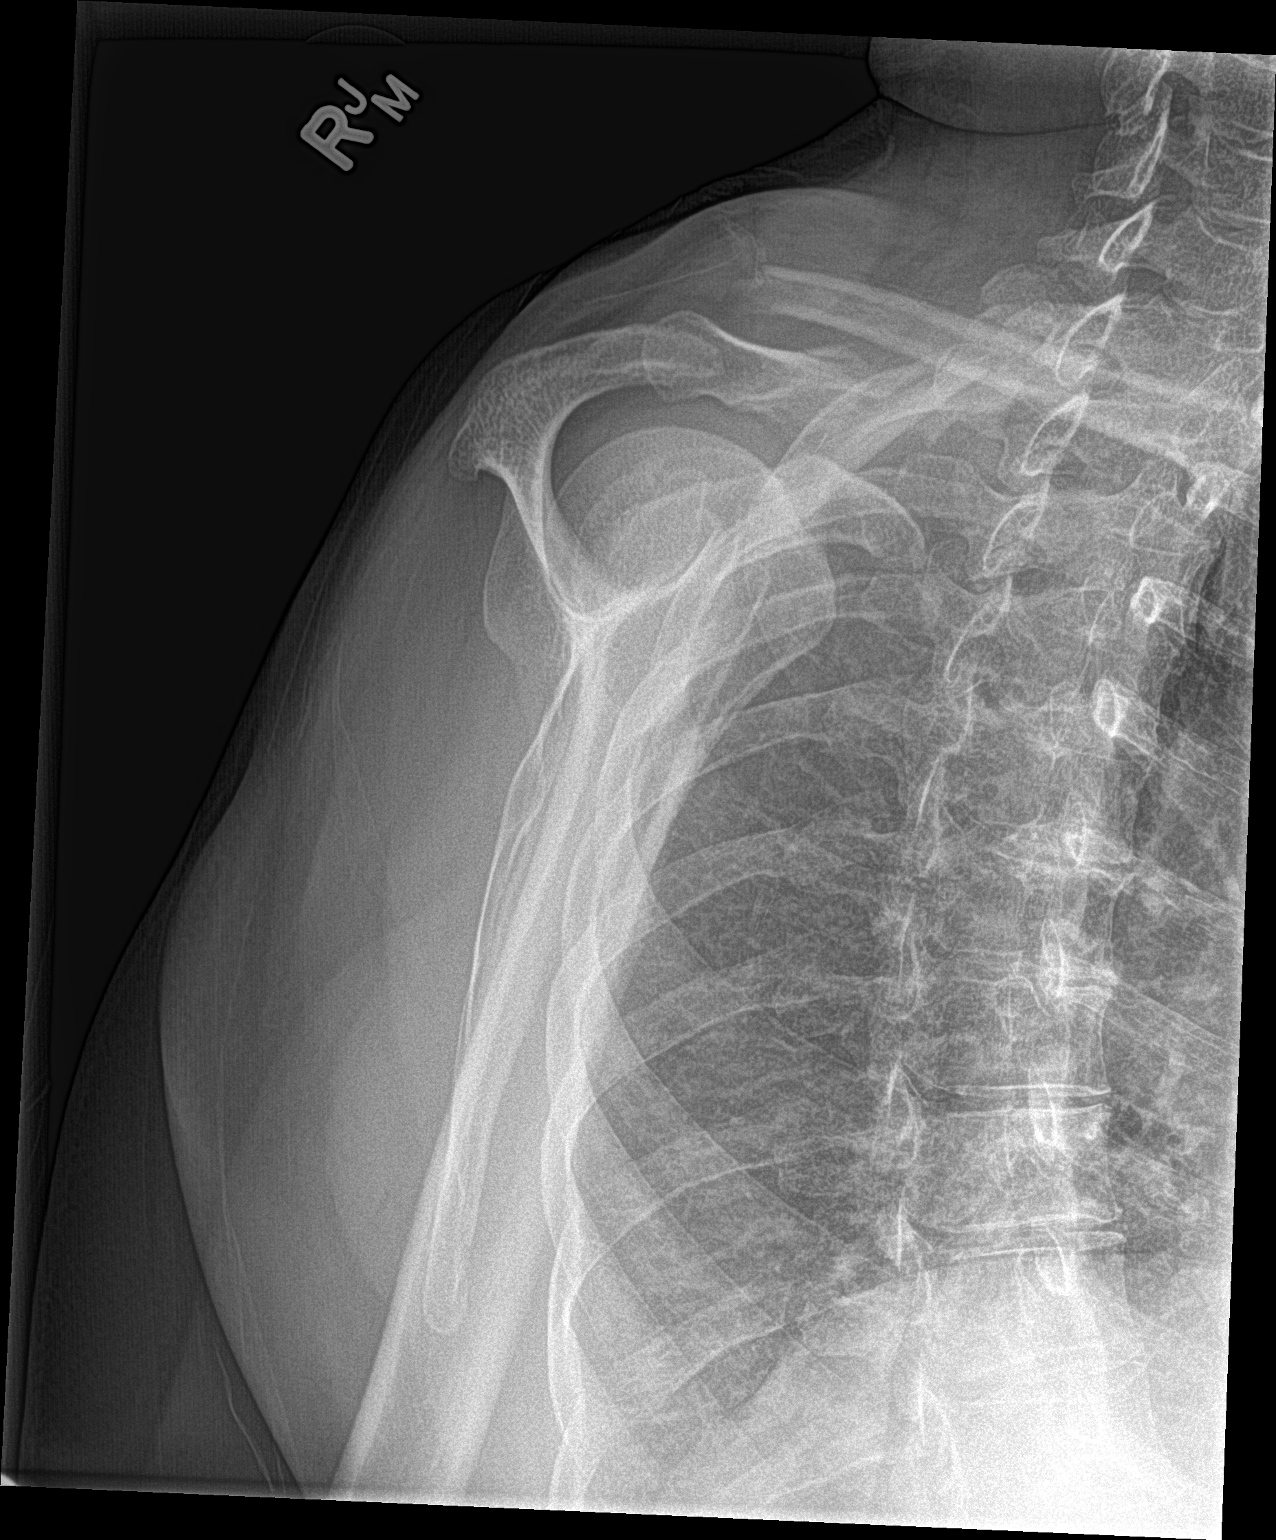

[2 of 2 positions shown; findings below may reference images not displayed]

FINDINGS: Displaced fracture noted through the distal right clavicle with
overlapping fracture fragments. AC joint and glenohumeral joint are
intact. No proximal humeral fracture.
IMPRESSION: Displaced, overlapping distal right clavicle fracture.

## 2016-10-23 IMAGING — RF DG CLAVICLE*R*
1 series · 1 of 1 positions shown · non-contrast
Comparison: Clavicle series of November 17, 2014

CLINICAL DATA: Status post ORIF of the right clavicle ; 8 seconds
of fluoro time reported

EXAM:
DG C-ARM 61-120 MIN; RIGHT CLAVICLE - 2+ VIEWS

[Series 1: run · 1 of 1 slices shown]
[im 1/1]
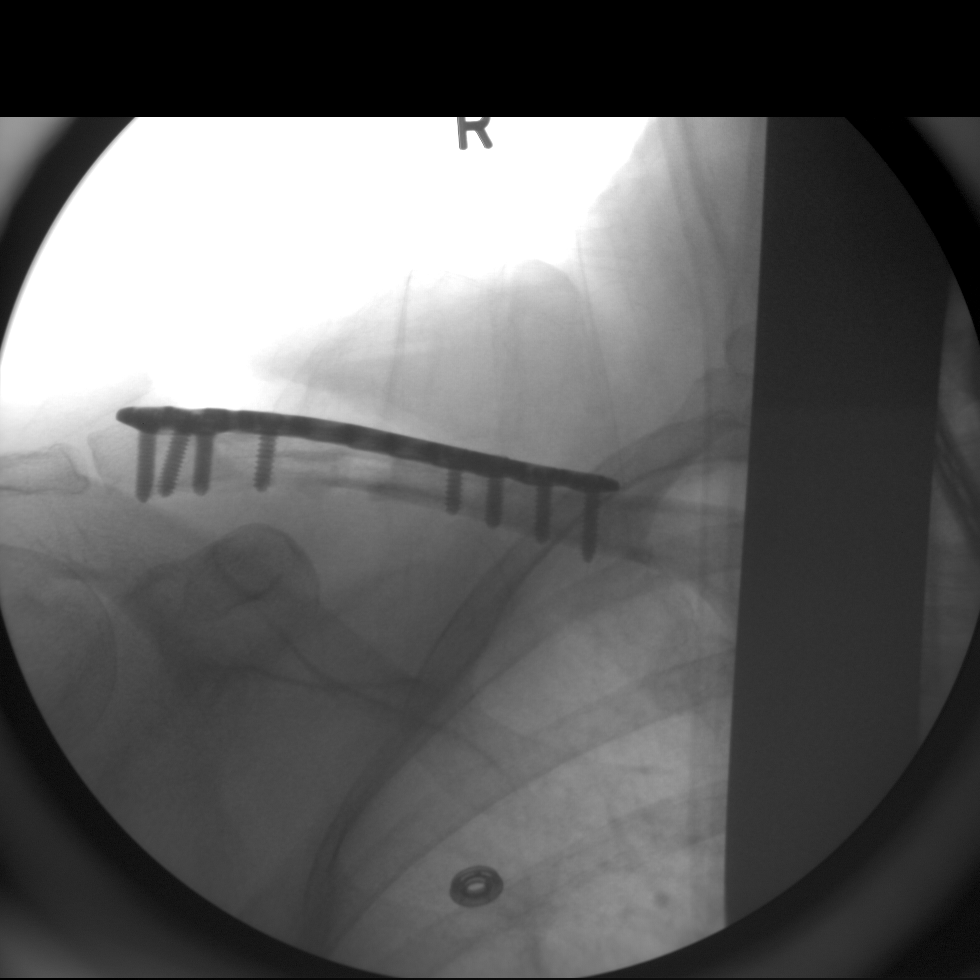

[1 of 1 positions shown; findings below may reference images not displayed]

FINDINGS: 2 fluoro spot films reveal the placement of a side plate with
cortical screws through the middle and distal portions of the
clavicle.
IMPRESSION: Successful ORIF of a midshaft right clavicular fracture fragment
with the fracture fragments now more nearly in anatomic alignment.
